# Patient Record
Sex: Female | Born: 1950 | Race: White | Hispanic: No | Smoking: Never smoker
Health system: Southern US, Community
[De-identification: ages and names within clinical notes are randomized; demographics above are authoritative.]

## PROBLEM LIST (undated history)

## (undated) DIAGNOSIS — E049 Nontoxic goiter, unspecified: Secondary | ICD-10-CM

## (undated) DIAGNOSIS — M75101 Unspecified rotator cuff tear or rupture of right shoulder, not specified as traumatic: Secondary | ICD-10-CM

## (undated) DIAGNOSIS — J189 Pneumonia, unspecified organism: Secondary | ICD-10-CM

## (undated) HISTORY — DX: Nontoxic goiter, unspecified: E04.9

## (undated) HISTORY — DX: Unspecified rotator cuff tear or rupture of right shoulder, not specified as traumatic: M75.101

## (undated) HISTORY — PX: CHOLECYSTECTOMY: SHX55

---

## 1997-11-20 ENCOUNTER — Other Ambulatory Visit: Admission: RE | Admit: 1997-11-20 | Discharge: 1997-11-20 | Payer: Self-pay | Admitting: *Deleted

## 1998-09-22 ENCOUNTER — Other Ambulatory Visit: Admission: RE | Admit: 1998-09-22 | Discharge: 1998-09-22 | Payer: Self-pay | Admitting: *Deleted

## 1999-10-05 ENCOUNTER — Other Ambulatory Visit: Admission: RE | Admit: 1999-10-05 | Discharge: 1999-10-05 | Payer: Self-pay | Admitting: *Deleted

## 2000-09-05 ENCOUNTER — Other Ambulatory Visit: Admission: RE | Admit: 2000-09-05 | Discharge: 2000-09-05 | Payer: Self-pay | Admitting: *Deleted

## 2003-02-04 ENCOUNTER — Other Ambulatory Visit: Admission: RE | Admit: 2003-02-04 | Discharge: 2003-02-04 | Payer: Self-pay | Admitting: Gynecology

## 2004-01-01 ENCOUNTER — Ambulatory Visit: Payer: Self-pay | Admitting: Orthopedic Surgery

## 2004-01-15 ENCOUNTER — Ambulatory Visit (HOSPITAL_COMMUNITY): Admission: RE | Admit: 2004-01-15 | Discharge: 2004-01-15 | Payer: Self-pay | Admitting: Internal Medicine

## 2004-02-05 ENCOUNTER — Other Ambulatory Visit: Admission: RE | Admit: 2004-02-05 | Discharge: 2004-02-05 | Payer: Self-pay | Admitting: Gynecology

## 2004-05-14 ENCOUNTER — Encounter (INDEPENDENT_AMBULATORY_CARE_PROVIDER_SITE_OTHER): Payer: Self-pay | Admitting: *Deleted

## 2004-05-14 ENCOUNTER — Ambulatory Visit (HOSPITAL_BASED_OUTPATIENT_CLINIC_OR_DEPARTMENT_OTHER): Admission: RE | Admit: 2004-05-14 | Discharge: 2004-05-14 | Payer: Self-pay | Admitting: Gynecology

## 2005-01-18 ENCOUNTER — Other Ambulatory Visit: Admission: RE | Admit: 2005-01-18 | Discharge: 2005-01-18 | Payer: Self-pay | Admitting: Gynecology

## 2005-11-10 ENCOUNTER — Ambulatory Visit (HOSPITAL_COMMUNITY): Admission: RE | Admit: 2005-11-10 | Discharge: 2005-11-10 | Payer: Self-pay | Admitting: General Surgery

## 2006-01-24 ENCOUNTER — Other Ambulatory Visit: Admission: RE | Admit: 2006-01-24 | Discharge: 2006-01-24 | Payer: Self-pay | Admitting: Gynecology

## 2006-08-30 ENCOUNTER — Emergency Department (HOSPITAL_COMMUNITY): Admission: EM | Admit: 2006-08-30 | Discharge: 2006-08-30 | Payer: Self-pay | Admitting: Emergency Medicine

## 2010-04-07 ENCOUNTER — Other Ambulatory Visit: Payer: Self-pay | Admitting: Gynecology

## 2010-05-10 ENCOUNTER — Other Ambulatory Visit: Payer: Self-pay | Admitting: Gastroenterology

## 2010-07-02 NOTE — Op Note (Signed)
NAME:  Lisa, Cisneros NO.:  [id number]   MEDICAL RECORD NO.:  [id number]          PATIENT TYPE:  AMB   LOCATION:  [facility]                         FACILITY:  [facility]   PHYSICIAN:  Gretta Cool, M.D. DATE OF BIRTH:  09-Sep-1950   DATE OF PROCEDURE:  DATE OF DISCHARGE:                                 OPERATIVE REPORT   PREOPERATIVE DIAGNOSIS:  Abnormal uterine bleeding with endometrial polyps  and endometrial thickening on ultrasound.  Simple hyperplasia on dilation  and curettage.   POSTOPERATIVE DIAGNOSES:  Abnormal uterine bleeding with endometrial polyps  and endometrial thickening on ultrasound.  Simple hyperplasia on dilation  and curettage.   PROCEDURE:  Hysteroscopy resection of multiple endometrial polyps, total  endometrial resection for ablation.   SURGEON:  Gretta Cool, M.D.   ANESTHESIA:  IV sedation and paracervical block.   DESCRIPTION OF PROCEDURE:  Under satisfactory IV sedation as above with the  patient completely unaware of any discomfort, the cervix was grasped with a  single-tooth tenaculum and pulled down in view.  It was then progressively  dilated with a series of Pratt dilators.  At this point the 7 mm  resectoscope was introduced and the cavity photographed.  There were  multiple polyps, very large tubal ostia were present on both cornual areas.  There was no evidence of fibroid or other pathology.  The cornual areas were  first resected and then sealed by cautery.  The polyps were then removed and  the entire endometrial cavity then resected so as to remove the endometrium  down 5 mm or more into the myometrium so as to remove all of the endometrial  tissue.  Once the entire cavity was resected the cavity was treated with  VaporTrode so as to eliminate any islands of residual endometrial tissue.  At the end of the procedure there was no bleeding at reduced pressure.  The  procedure was terminated without complication and the  patient returned to  the recovery room in excellent condition.  Fluid deficit 40 mL.      CWL/MEDQ  D:  05/14/2004  T:  05/14/2004  Job:  [id number]

## 2011-04-13 ENCOUNTER — Other Ambulatory Visit: Payer: Self-pay | Admitting: Gynecology

## 2011-04-13 LAB — COMPREHENSIVE METABOLIC PANEL
ALT: 13 U/L (ref 7–35)
AST: 20 U/L
Albumin: 3.8
Alkaline Phosphatase: 52 U/L
BUN: 13 mg/dL (ref 4–21)
Creat: 0.67
Glucose: 80
Sodium: 142 mmol/L (ref 137–147)
Total Bilirubin: 0.5 mg/dL

## 2011-04-13 LAB — CBC WITH DIFFERENTIAL/PLATELET
HCT: 41 %
Hemoglobin: 13.6 g/dL (ref 12.0–16.0)
MCV: 92.8 fL
WBC: 5.6

## 2011-06-27 ENCOUNTER — Encounter: Payer: Self-pay | Admitting: Gastroenterology

## 2011-06-27 ENCOUNTER — Ambulatory Visit (INDEPENDENT_AMBULATORY_CARE_PROVIDER_SITE_OTHER): Payer: BC Managed Care – PPO | Admitting: Gastroenterology

## 2011-06-27 VITALS — BP 127/71 | HR 62 | Temp 97.7°F | Ht 63.5 in | Wt 105.2 lb

## 2011-06-27 DIAGNOSIS — R197 Diarrhea, unspecified: Secondary | ICD-10-CM

## 2011-06-27 DIAGNOSIS — R198 Other specified symptoms and signs involving the digestive system and abdomen: Secondary | ICD-10-CM

## 2011-06-27 DIAGNOSIS — R109 Unspecified abdominal pain: Secondary | ICD-10-CM

## 2011-06-27 DIAGNOSIS — R194 Change in bowel habit: Secondary | ICD-10-CM

## 2011-06-27 MED ORDER — ALIGN 4 MG PO CAPS: 4.0000 mg | ORAL_CAPSULE | Freq: Every day | ORAL | Status: DC

## 2011-06-27 MED ORDER — DICYCLOMINE HCL 10 MG PO CAPS
10.0000 mg | ORAL_CAPSULE | Freq: Three times a day (TID) | ORAL | Status: DC | PRN
Start: 2011-06-27 — End: 2021-07-22

## 2011-06-27 NOTE — Progress Notes (Signed)
Faxed to PCP

## 2011-06-27 NOTE — Assessment & Plan Note (Signed)
Three weeks h/o acute/persistent diarrhea and abdominal cramping. Thought she had virus but symptoms never improved. No blood in stool. Diminished appetite. Patient c/o significant personal stress and feels it is affecting her appetite. Need to exclude infectious etiology. She had screening colonoscopy one year ago by Dr. Kinnie Scales and states she had inflamed rectum but negative biopsy. Will request records as well as last labs. Start bentyl 10mg  every morning and may use up to tid if needed. Start Align one daily. Based on findings, she may need w/u for microscopic colitis.

## 2011-06-27 NOTE — Progress Notes (Signed)
Primary Care Physician:  Carylon Perches, MD, MD  Primary Gastroenterologist:  Roetta Sessions, MD   Chief Complaint  Patient presents with  . Diarrhea  . Abdominal Pain    HPI:  Lisa Cisneros is a 61 y.o. female here as self-referral for change in bowels and abdominal cramps.  Symptoms started three weeks. Started with several loos bowel movements. Thought she had a virus but symptoms never went completely away. Diarrhea loose several times each morning. Abdominal cramping with it. No n/v. Stress since 02/2011, appetite not as good. Denies prior h/o IBS. No melena, brbpr. Lots of gas. No nocturnal diarrhea. Tried anti-diarrheal medicine and Pepto-Bismol but didn't seem to help much. Only new medication is melatonin for past one month. No heartburn. No unintentional weight loss.   Colonoscopy in [place], Dr. Kinnie Scales, normal exam, 04/2010. No prior EGD. Last labs done by Dr. Nicholas Lose in 04/2011.   Current Outpatient Prescriptions  Medication Sig Dispense Refill  . Acetylcarnitine HCl (ACETYL L-CARNITINE) 500 MG CAPS Take by mouth.      . ALPHA LIPOIC ACID PO Take 600 mg by mouth daily.      Florian Buff Oil-GLA-Linoleic Acid (BORAGE PO) Take 800 mg by mouth daily.      . Cholecalciferol (VITAMIN D-3 PO) Take 2,000 Units by mouth daily.      . Cyanocobalamin (VITAMIN B-12) 500 MCG SUBL Place under the tongue.      . fish oil-omega-3 fatty acids 1000 MG capsule Take 2 g by mouth daily.      . Flaxseed, Linseed, (FLAX PO) Take 800 mg by mouth daily.      . folic acid (FOLVITE) 800 MCG tablet Take 400 mcg by mouth daily.      Marland Kitchen Kelp 225 MCG TABS Take by mouth.      . Melatonin 10 MG CAPS Take 10 mg by mouth daily.      Marland Kitchen MINIVELLE 0.075 MG/24HR Place 1 patch onto the skin 2 (two) times a week.       . progesterone (PROMETRIUM) 200 MG capsule Take 200 mg by mouth every 3 (three) months.        Allergies as of 06/27/2011 - Review Complete 06/27/2011  Allergen Reaction Noted  . Codeine Nausea And  Vomiting 06/27/2011  EES causes severe hunger pains  Past Medical History  Diagnosis Date  . Goiter      Past Surgical History  Procedure Date  . Cholecystectomy    Family History  Problem Relation Age of Onset  . Alzheimer's disease Mother   . Colon cancer Neg Hx   . Liver disease Neg Hx     History   Social History  . Marital Status: Divorced    Spouse Name: N/A    Number of Children: 1  . Years of Education: N/A   Occupational History  . works at [employer] History Main Topics  . Smoking status: Never Smoker   . Smokeless tobacco: None  . Alcohol Use: Yes     3 glass in a week  . Drug Use: No  . Sexually Active: None   Other Topics Concern  . None   Social History Narrative  . None     ROS:  General: Negative for anorexia, weight loss, fever, chills, fatigue, weakness. Appetite diminished some.  Eyes: Negative for vision changes.  ENT: Negative for hoarseness, difficulty swallowing , nasal congestion. CV: Negative for chest pain, angina, palpitations, dyspnea on exertion, peripheral edema.  Respiratory:  Negative for dyspnea at rest, dyspnea on exertion, cough, sputum, wheezing.  GI: See history of present illness. GU:  Negative for dysuria, hematuria, urinary incontinence, urinary frequency. Positive for nocturnal urination.  MS: Negative for joint pain, low back pain.  Derm: Negative for rash or itching.  Neuro: Negative for weakness, abnormal sensation, seizure, frequent headaches, memory loss, confusion.  Psych: Negative for anxiety, depression, suicidal ideation, hallucinations. Lots of stress but would not elaborate. Endo: Negative for unusual weight change.  Heme: Negative for bruising or bleeding. Allergy: Negative for rash or hives.    Physical Examination:  BP 127/71  Pulse 62  Temp(Src) 97.7 F (36.5 C) (Temporal)  Ht 5' 3.5" (1.613 m)  Wt 105 lb 3.2 oz (47.718 kg)  BMI 18.34 kg/m2   General: Thin, well-developed in no acute  distress.  Head: Normocephalic, atraumatic.   Eyes: Conjunctiva pink, no icterus. Mouth: Oropharyngeal mucosa moist and pink , no lesions erythema or exudate. Neck: Supple without thyromegaly, masses, or lymphadenopathy.  Lungs: Clear to auscultation bilaterally.  Heart: Regular rate and rhythm, no murmurs rubs or gallops.  Abdomen: Bowel sounds are normal, nontender, nondistended, no hepatosplenomegaly or masses, no abdominal bruits or hernia , no rebound or guarding.   Rectal: Not performed. Extremities: No lower extremity edema. No clubbing or deformities.  Neuro: Alert and oriented x 4 , grossly normal neurologically.  Skin: Warm and dry, no rash or jaundice.   Psych: Alert and cooperative, normal mood and affect.

## 2011-06-27 NOTE — Patient Instructions (Signed)
We will review your recent labs and last colonoscopy records. Please have your stool test done. Please start Bentyl 10mg  every morning before breakfast. You may take up to three times a day for your diarrhea, abdominal cramps. Please take Align once daily for next three weeks, samples provided. This is a good probiotic to help with digestion and gas.

## 2011-06-30 LAB — CLOSTRIDIUM DIFFICILE BY PCR: Toxigenic C. Difficile by PCR: NOT DETECTED

## 2011-06-30 LAB — GIARDIA/CRYPTOSPORIDIUM (EIA)
Cryptosporidium Screen (EIA): NEGATIVE
Giardia Screen (EIA): NEGATIVE

## 2011-06-30 NOTE — Progress Notes (Signed)
Quick Note:  Await stool culture ______ 

## 2011-07-03 LAB — STOOL CULTURE

## 2011-07-13 NOTE — Progress Notes (Signed)
Reviewed colonoscopy reports from Dr. Sharrell Ku.Colonoscopy for March 2012, prep was good. Colon was normal except for a thickened and erythematous rectal mucosa the mucosa was intact without ulceration. She had internal hemorrhoids. Endoscopic picture most consistent with chronic prolapse. Biopsy nonspecific but with intramucosal fibrosis. Recommended to have a repeat colonoscopy in 2017. She had a colonoscopy in March 2007 as well. At that time she had acute proctitis which was mild. Biopsy showed fibrosis which can be seen with ischemia or chronic inflammatory bowel disease.  Labs from Dr. Nicholas Lose. February 2013. White blood cell count 5600, hemoglobin 13.6, hematocrit 41, MCV 92.8, platelets 224,000, sodium 142, potassium 4, glucose 80, BUN 13, creatinine 0.67, total bilirubin 0.5, alkaline phosphatase 52, 80 ST 20, ALT 13, albumin 3.8, TSH 2.169, CRP 0.6, hemoglobin A1c 5.6.

## 2011-07-13 NOTE — Progress Notes (Signed)
Quick Note:  All stools negative for infection.  Reviewed records from labs and last two colonoscopies. On the last TCS she had some mild proctitis vs prolapse, bx benign.  See how she is doing on bentyl and Align. If still with significant diarrhea, she may need repeat endoscopy with random bx to r/o microscopic colitis. ______

## 2011-07-13 NOTE — Progress Notes (Signed)
Quick Note:  Tried to call pt- LMOM ______ 

## 2012-04-17 ENCOUNTER — Other Ambulatory Visit: Payer: Self-pay | Admitting: Gynecology

## 2012-07-24 ENCOUNTER — Ambulatory Visit (INDEPENDENT_AMBULATORY_CARE_PROVIDER_SITE_OTHER): Payer: BC Managed Care – PPO

## 2012-07-24 ENCOUNTER — Ambulatory Visit (INDEPENDENT_AMBULATORY_CARE_PROVIDER_SITE_OTHER): Payer: BC Managed Care – PPO | Admitting: Orthopedic Surgery

## 2012-07-24 ENCOUNTER — Encounter: Payer: Self-pay | Admitting: Orthopedic Surgery

## 2012-07-24 VITALS — BP 116/80 | Ht 63.5 in | Wt 107.0 lb

## 2012-07-24 DIAGNOSIS — M67919 Unspecified disorder of synovium and tendon, unspecified shoulder: Secondary | ICD-10-CM

## 2012-07-24 DIAGNOSIS — M25519 Pain in unspecified shoulder: Secondary | ICD-10-CM

## 2012-07-24 DIAGNOSIS — M25511 Pain in right shoulder: Secondary | ICD-10-CM | POA: Insufficient documentation

## 2012-07-24 DIAGNOSIS — M75101 Unspecified rotator cuff tear or rupture of right shoulder, not specified as traumatic: Secondary | ICD-10-CM

## 2012-07-24 HISTORY — DX: Unspecified rotator cuff tear or rupture of right shoulder, not specified as traumatic: M75.101

## 2012-07-24 NOTE — Patient Instructions (Addendum)
Impingement Syndrome, Rotator Cuff, Bursitis with Rehab Impingement syndrome is a condition that involves inflammation of the tendons of the rotator cuff and the subacromial bursa, that causes pain in the shoulder. The rotator cuff consists of four tendons and muscles that control much of the shoulder and upper arm function. The subacromial bursa is a fluid filled sac that helps reduce friction between the rotator cuff and one of the bones of the shoulder (acromion). Impingement syndrome is usually an overuse injury that causes swelling of the bursa (bursitis), swelling of the tendon (tendonitis), and/or a tear of the tendon (strain). Strains are classified into three categories. Grade 1 strains cause pain, but the tendon is not lengthened. Grade 2 strains include a lengthened ligament, due to the ligament being stretched or partially ruptured. With grade 2 strains there is still function, although the function may be decreased. Grade 3 strains include a complete tear of the tendon or muscle, and function is usually impaired. SYMPTOMS   Pain around the shoulder, often at the outer portion of the upper arm.  Pain that gets worse with shoulder function, especially when reaching overhead or lifting.  Sometimes, aching when not using the arm.  Pain that wakes you up at night.  Sometimes, tenderness, swelling, warmth, or redness over the affected area.  Loss of strength.  Limited motion of the shoulder, especially reaching behind the back (to the back pocket or to unhook bra) or across your body.  Crackling sound (crepitation) when moving the arm.  Biceps tendon pain and inflammation (in the front of the shoulder). Worse when bending the elbow or lifting. CAUSES  Impingement syndrome is often an overuse injury, in which chronic (repetitive) motions cause the tendons or bursa to become inflamed. A strain occurs when a force is paced on the tendon or muscle that is greater than it can withstand.  Common mechanisms of injury include: Stress from sudden increase in duration, frequency, or intensity of training.  Direct hit (trauma) to the shoulder.  Aging, erosion of the tendon with normal use.  Bony bump on shoulder (acromial spur). RISK INCREASES WITH:  Contact sports (football, wrestling, boxing).  Throwing sports (baseball, tennis, volleyball).  Weightlifting and bodybuilding.  Heavy labor.  Previous injury to the rotator cuff, including impingement.  Poor shoulder strength and flexibility.  Failure to warm up properly before activity.  Inadequate protective equipment.  Old age.  Bony bump on shoulder (acromial spur). PREVENTION   Warm up and stretch properly before activity.  Allow for adequate recovery between workouts.  Maintain physical fitness:  Strength, flexibility, and endurance.  Cardiovascular fitness.  Learn and use proper exercise technique. PROGNOSIS  If treated properly, impingement syndrome usually goes away within 6 weeks. Sometimes surgery is required.  RELATED COMPLICATIONS   Longer healing time if not properly treated, or if not given enough time to heal.  Recurring symptoms, that result in a chronic condition.  Shoulder stiffness, frozen shoulder, or loss of motion.  Rotator cuff tendon tear.  Recurring symptoms, especially if activity is resumed too soon, with overuse, with a direct blow, or when using poor technique. TREATMENT  Treatment first involves the use of ice and medicine, to reduce pain and inflammation. The use of strengthening and stretching exercises may help reduce pain with activity. These exercises may be performed at home or with a therapist. If non-surgical treatment is unsuccessful after more than 6 months, surgery may be advised. After surgery and rehabilitation, activity is usually possible in 3 months.  MEDICATION  If pain medicine is needed, nonsteroidal anti-inflammatory medicines (aspirin and  ibuprofen), or other minor pain relievers (acetaminophen), are often advised.  Do not take pain medicine for 7 days before surgery.  Prescription pain relievers may be given, if your caregiver thinks they are needed. Use only as directed and only as much as you need.  Corticosteroid injections may be given by your caregiver. These injections should be reserved for the most serious cases, because they may only be given a certain number of times. HEAT AND COLD  Cold treatment (icing) should be applied for 10 to 15 minutes every 2 to 3 hours for inflammation and pain, and immediately after activity that aggravates your symptoms. Use ice packs or an ice massage.  Heat treatment may be used before performing stretching and strengthening activities prescribed by your caregiver, physical therapist, or athletic trainer. Use a heat pack or a warm water soak. SEEK MEDICAL CARE IF:   Symptoms get worse or do not improve in 4 to 6 weeks, despite treatment.  New, unexplained symptoms develop. (Drugs used in treatment may produce side effects.)   Start home exercises as directed

## 2012-07-24 NOTE — Progress Notes (Signed)
Patient ID: Lisa Cisneros, female   DOB: 07/26/1950, 62 y.o.   MRN: [id number] Chief Complaint  Patient presents with  . 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 Current treatment Advil and Aleve as needed. Pain described as sharp and stabbing, 7/10, constant, better with ice worse with movements of the arm especially with the arm away from her body. She has a history of cervical bulging disc previous MRI and neurosurgical evaluation-treated with exercise, there is some catching in the shoulder and some numbness in the hands which may be related to the cervical disc and/or carpal tunnel syndrome  She is allergic to codeine and latex  She has had a cholecystectomy. She has no other major medical problems listed. She has a family history of heart disease and diabetes. 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 she doesn't smoke she has an occasional glass of red wine several times a week and she has a high school degree posterior year of college  Review of systems she has numbness anxiety itching listed she has joint pain and stiffness and muscle pain related to her arm the other systems were reviewed and were negative  Her medications include texture strength joint juice, multivitamin, B12, folic acid, multiecho make a, zinc lozenge, l-lysine, closed, Calc, cinnamon.BP 116/80  Ht 5' 3.5" (1.613 m)  Wt 107 lb (48.535 kg)  BMI 18.65 kg/m2 General appearance is normal, the patient is alert and oriented x3 with normal mood and affect. She walk without any assistive devices. Her left shoulder is not tender to palpation, she has full active range of motion. She has a negative apprehension sign. She has a strong 5 out of 5 rotator cuff with normal skin normal pulse normal temperature and normal sensation  Right shoulder is tender anterior rotator interval, posterior inferior to the acromion. She has  full range of motion the Hawkins maneuver produced some discomfort the Neer maneuver did not she had some tightness in terms of internal rotation she has a negative apprehension sign she had mild weakness in the supraspinatus with the empty can sign. Skin is intact good pulse normal temperature normal sensation  X-rays show normal shoulder with increased sclerotic bone at the greater tuberosity which may indicate chronic stress at the rotator cuff insertion  Impression Encounter Diagnoses  Name Primary?  . Right shoulder pain Yes  . Rotator cuff syndrome of right shoulder     Plan We gave her a subacromial injection and advised home exercises followup 6 weeks  Shoulder Injection Procedure Note   Pre-operative Diagnosis: right  RC Syndrome  Post-operative Diagnosis: same  Indications: pain   Anesthesia: ethyl chloride   Procedure Details   Verbal consent was obtained for the procedure. The shoulder was prepped withalcohol and the skin was anesthetized. A 20 gauge needle was advanced into the subacromial space through posterior approach without difficulty  The space was then injected with 3 ml 1% lidocaine and 1 ml of depomedrol. The injection site was cleansed with isopropyl alcohol and a dressing was applied.  Complications:  None; patient tolerated the procedure well.

## 2012-09-06 ENCOUNTER — Ambulatory Visit: Payer: BC Managed Care – PPO | Admitting: Orthopedic Surgery

## 2014-08-27 DIAGNOSIS — Z1283 Encounter for screening for malignant neoplasm of skin: Secondary | ICD-10-CM | POA: Diagnosis not present

## 2014-08-27 DIAGNOSIS — L820 Inflamed seborrheic keratosis: Secondary | ICD-10-CM | POA: Diagnosis not present

## 2014-08-27 DIAGNOSIS — S70362A Insect bite (nonvenomous), left thigh, initial encounter: Secondary | ICD-10-CM | POA: Diagnosis not present

## 2014-10-27 DIAGNOSIS — J029 Acute pharyngitis, unspecified: Secondary | ICD-10-CM | POA: Diagnosis not present

## 2014-10-27 DIAGNOSIS — Z681 Body mass index (BMI) 19 or less, adult: Secondary | ICD-10-CM | POA: Diagnosis not present

## 2014-11-03 ENCOUNTER — Emergency Department (HOSPITAL_COMMUNITY): Payer: Medicare Other

## 2014-11-03 ENCOUNTER — Encounter (HOSPITAL_COMMUNITY): Payer: Self-pay | Admitting: *Deleted

## 2014-11-03 ENCOUNTER — Emergency Department (HOSPITAL_COMMUNITY)
Admission: EM | Admit: 2014-11-03 | Discharge: 2014-11-03 | Disposition: A | Discharge status: Home / Self Care | Payer: Medicare Other | Attending: Emergency Medicine | Admitting: Emergency Medicine

## 2014-11-03 DIAGNOSIS — Z79899 Other long term (current) drug therapy: Secondary | ICD-10-CM | POA: Diagnosis not present

## 2014-11-03 DIAGNOSIS — J189 Pneumonia, unspecified organism: Secondary | ICD-10-CM

## 2014-11-03 DIAGNOSIS — Z8639 Personal history of other endocrine, nutritional and metabolic disease: Secondary | ICD-10-CM | POA: Insufficient documentation

## 2014-11-03 DIAGNOSIS — Z91040 Latex allergy status: Secondary | ICD-10-CM | POA: Diagnosis not present

## 2014-11-03 DIAGNOSIS — Z8739 Personal history of other diseases of the musculoskeletal system and connective tissue: Secondary | ICD-10-CM | POA: Diagnosis not present

## 2014-11-03 DIAGNOSIS — R05 Cough: Secondary | ICD-10-CM | POA: Diagnosis present

## 2014-11-03 DIAGNOSIS — J159 Unspecified bacterial pneumonia: Secondary | ICD-10-CM | POA: Insufficient documentation

## 2014-11-03 HISTORY — DX: Pneumonia, unspecified organism: J18.9

## 2014-11-03 LAB — BASIC METABOLIC PANEL
Anion gap: 8 (ref 5–15)
BUN: 8 mg/dL (ref 6–20)
CALCIUM: 8.4 mg/dL — AB (ref 8.9–10.3)
CO2: 27 mmol/L (ref 22–32)
Chloride: 100 mmol/L — ABNORMAL LOW (ref 101–111)
Creatinine, Ser: 0.59 mg/dL (ref 0.44–1.00)
GFR calc non Af Amer: >60 mL/min (ref >60)
Glucose, Bld: 134 mg/dL — ABNORMAL HIGH (ref 65–99)
Potassium: 3.7 mmol/L (ref 3.5–5.1)
Sodium: 135 mmol/L (ref 135–145)

## 2014-11-03 LAB — CBC WITH DIFFERENTIAL/PLATELET
Basophils Absolute: 0 10*3/uL (ref 0.0–0.1)
Basophils Relative: 0 %
Eosinophils Absolute: 0 10*3/uL (ref 0.0–0.7)
Eosinophils Relative: 0 %
HCT: 36.8 % (ref 36.0–46.0)
Hemoglobin: 13.1 g/dL (ref 12.0–15.0)
Lymphocytes Relative: 11 %
Lymphs Abs: 1.6 10*3/uL (ref 0.7–4.0)
MCH: 32 pg (ref 26.0–34.0)
MCHC: 35.6 g/dL (ref 30.0–36.0)
MCV: 89.8 fL (ref 78.0–100.0)
MONOS PCT: 5 %
Monocytes Absolute: 0.6 10*3/uL (ref 0.1–1.0)
Neutro Abs: 12.1 10*3/uL — ABNORMAL HIGH (ref 1.7–7.7)
Neutrophils Relative %: 84 %
Platelets: 352 10*3/uL (ref 150–400)
RBC: 4.1 MIL/uL (ref 3.87–5.11)
RDW: 12.3 % (ref 11.5–15.5)
WBC: 14.4 10*3/uL — ABNORMAL HIGH (ref 4.0–10.5)

## 2014-11-03 MED ORDER — ONDANSETRON 4 MG PO TBDP: 4.0000 mg | ORAL_TABLET | Freq: Three times a day (TID) | ORAL | Status: DC | PRN

## 2014-11-03 MED ORDER — SODIUM CHLORIDE 0.9 % IV BOLUS (SEPSIS)
1000.0000 mL | Freq: Once | INTRAVENOUS | Status: AC
  Administered 2014-11-03: 1000 mL via INTRAVENOUS

## 2014-11-03 MED ORDER — SODIUM CHLORIDE 0.9 % IV SOLN: Freq: Once | INTRAVENOUS | Status: DC

## 2014-11-03 MED ORDER — ONDANSETRON HCL 4 MG/2ML IJ SOLN
4.0000 mg | Freq: Once | INTRAMUSCULAR | Status: AC
  Administered 2014-11-03: 4 mg via INTRAVENOUS
  Filled 2014-11-03: qty 2

## 2014-11-03 MED ORDER — DEXTROSE 5 % IV SOLN
1.0000 g | Freq: Once | INTRAVENOUS | Status: AC
  Administered 2014-11-03: 1 g via INTRAVENOUS
  Filled 2014-11-03: qty 10

## 2014-11-03 MED ORDER — LEVOFLOXACIN 500 MG PO TABS: 500.0000 mg | ORAL_TABLET | Freq: Every day | ORAL | Status: DC

## 2014-11-03 NOTE — ED Notes (Signed)
Pt c/o coughing up green sputum and states she has been vomiting all day; pt states she has been taking amoxicillin for the last 10 days for strep throat

## 2014-11-03 NOTE — Discharge Instructions (Signed)
Pneumonia, Adult  Pneumonia is an infection of the lungs. It may be caused by a germ (virus or bacteria). Some types of pneumonia can spread easily from person to person. This can happen when you cough or sneeze.  HOME CARE   Only take medicine as told by your doctor.   Take your medicine (antibiotics) as told. Finish it even if you start to feel better.   Do not smoke.   You may use a vaporizer or humidifier in your room. This can help loosen thick spit (mucus).   Sleep so you are almost sitting up (semi-upright). This helps reduce coughing.   Rest.  A shot (vaccine) can help prevent pneumonia. Shots are often advised for:   People over 65 years old.   Patients on chemotherapy.   People with long-term (chronic) lung problems.   People with immune system problems.  GET HELP RIGHT AWAY IF:    You are getting worse.   You cannot control your cough, and you are losing sleep.   You cough up blood.   Your pain gets worse, even with medicine.   You have a fever.   Any of your problems are getting worse, not better.   You have shortness of breath or chest pain.  MAKE SURE YOU:    Understand these instructions.   Will watch your condition.   Will get help right away if you are not doing well or get worse.  Document Released: 07/20/2007 Document Revised: 04/25/2011 Document Reviewed: 04/23/2010  Exit[employer] Patient Information 2015 [employer]. This information is not intended to replace advice given to you by your health care provider. Make sure you discuss any questions you have with your health care provider.

## 2014-11-04 NOTE — ED Provider Notes (Signed)
CSN: [id number]     Arrival date & time 11/03/14  1917 History   First MD Initiated Contact with Patient 11/03/14 2058     Chief Complaint  Patient presents with  . Cough      HPI  She presents for evaluation of vomiting. History of "strep throat" diagnosed clinically by her physician. His finishing day 8 of amoxicillin. Waking this morning with some nausea that she attributes to the amoxicillin. Started vomiting later in the day and is unable to stop. Presents here for evaluation. Has had a continued cough. No chest pain. Her throat is "better".  Past Medical History  Diagnosis Date  . Goiter   . Rotator cuff syndrome of right shoulder 07/24/2012  . Pneumonia    Past Surgical History  Procedure Laterality Date  . Cholecystectomy     Family History  Problem Relation Age of Onset  . Alzheimer's disease Mother   . Colon cancer Neg Hx   . Liver disease Neg Hx    Social History  Substance Use Topics  . Smoking status: Never Smoker   . Smokeless tobacco: None  . Alcohol Use: Yes     Comment: 3 glass in a week   OB History    No data available     Review of Systems  Constitutional: Positive for fever. Negative for chills, diaphoresis, appetite change and fatigue.  HENT: Positive for sore throat. Negative for mouth sores and trouble swallowing.   Eyes: Negative for visual disturbance.  Respiratory: Positive for cough. Negative for chest tightness, shortness of breath and wheezing.   Cardiovascular: Negative for chest pain.  Gastrointestinal: Positive for nausea and vomiting. Negative for abdominal pain, diarrhea and abdominal distention.  Endocrine: Negative for polydipsia, polyphagia and polyuria.  Genitourinary: Negative for dysuria, frequency and hematuria.  Musculoskeletal: Negative for gait problem.  Skin: Negative for color change, pallor and rash.  Neurological: Negative for dizziness, syncope, light-headedness and headaches.  Hematological: Does not bruise/bleed  easily.  Psychiatric/Behavioral: Negative for behavioral problems and confusion.      Allergies  Codeine; Erythromycin ethylsuccinate; and Latex  Home Medications   Prior to Admission medications   Medication Sig Start Date End Date Taking? Authorizing Provider  Acetylcarnitine HCl (ACETYL L-CARNITINE) 500 MG CAPS Take by mouth.    Historical Provider, MD  ALPHA LIPOIC ACID PO Take 600 mg by mouth daily.    Historical Provider, MD  Borage Oil-GLA-Linoleic Acid (BORAGE PO) Take 800 mg by mouth daily.    Historical Provider, MD  Cholecalciferol (VITAMIN D-3 PO) Take 2,000 Units by mouth daily.    Historical Provider, MD  Cyanocobalamin (VITAMIN B-12) 500 MCG SUBL Place under the tongue.    Historical Provider, MD  dicyclomine (BENTYL) 10 MG capsule Take 1 capsule (10 mg total) by mouth 3 (three) times daily as needed. 06/27/11 06/26/12  Mahala Menghini, PA-C  fish oil-omega-3 fatty acids 1000 MG capsule Take 2 g by mouth daily.    Historical Provider, MD  Flaxseed, Linseed, (FLAX PO) Take 800 mg by mouth daily.    Historical Provider, MD  folic acid (FOLVITE) 993 MCG tablet Take 400 mcg by mouth daily.    Historical Provider, MD  Kelp 225 MCG TABS Take by mouth.    Historical Provider, MD  levofloxacin (LEVAQUIN) 500 MG tablet Take 1 tablet (500 mg total) by mouth daily. 11/03/14   Tanna Furry, MD  Melatonin 10 MG CAPS Take 10 mg by mouth daily.    Historical Provider,  MD  MINIVELLE 0.075 MG/24HR Place 1 patch onto the skin 2 (two) times a week.  06/01/11   Historical Provider, MD  ondansetron (ZOFRAN ODT) 4 MG disintegrating tablet Take 1 tablet (4 mg total) by mouth every 8 (eight) hours as needed for nausea. 11/03/14   Tanna Furry, MD  Probiotic Product (ALIGN) 4 MG CAPS Take 4 mg by mouth daily. 06/27/11   Mahala Menghini, PA-C  progesterone (PROMETRIUM) 200 MG capsule Take 200 mg by mouth every 3 (three) months.    Historical Provider, MD   BP 129/65 mmHg  Pulse 71  Temp(Src) 98.6 F (37 C)  (Oral)  Resp 18  Ht 5\' 2"  (1.575 m)  Wt 105 lb (47.628 kg)  BMI 19.20 kg/m2  SpO2 98% Physical Exam  Constitutional: She is oriented to person, place, and time. She appears well-developed and well-nourished. No distress.  HENT:  Head: Normocephalic.  Pharynx appears normal. No exudate or hypertrophy. No abscess.  Eyes: Conjunctivae are normal. Pupils are equal, round, and reactive to light. No scleral icterus.  Neck: Normal range of motion. Neck supple. No thyromegaly present.  Cardiovascular: Normal rate and regular rhythm.  Exam reveals no gallop and no friction rub.   No murmur heard. Pulmonary/Chest: Effort normal and breath sounds normal. No respiratory distress. She has no wheezes. She has no rales.  Clear lungs. No abnormal breath sounds. No increased work of breathing.  Abdominal: Soft. Bowel sounds are normal. She exhibits no distension. There is no tenderness. There is no rebound.  Musculoskeletal: Normal range of motion.  Neurological: She is alert and oriented to person, place, and time.  Skin: Skin is warm and dry. No rash noted.  Psychiatric: She has a normal mood and affect. Her behavior is normal.    ED Course  Procedures (including critical care time) Labs Review Labs Reviewed  CBC WITH DIFFERENTIAL/PLATELET - Abnormal; Notable for the following:    WBC 14.4 (*)    Neutro Abs 12.1 (*)    All other components within normal limits  BASIC METABOLIC PANEL - Abnormal; Notable for the following:    Chloride 100 (*)    Glucose, Bld 134 (*)    Calcium 8.4 (*)    All other components within normal limits    Imaging Review Dg Chest 2 View  11/03/2014   CLINICAL DATA:  Cough. Green sputum. Vomiting all day. Taking amoxicillin for the last 10 days for strep throat.  EXAM: CHEST  2 VIEW  COMPARISON:  None.  FINDINGS: The lungs are hyperinflated. The heart is normal in size. There is right middle lobe infiltrate. No pleural effusions. No pulmonary edema. Mild mid thoracic  spondylosis. Surgical clips are noted in the upper abdomen.  IMPRESSION: Right middle lobe infiltrate.   Electronically Signed   By: Nolon Nations M.D.   On: 11/03/2014 22:40   I have personally reviewed and evaluated these images and lab results as part of my medical decision-making.   EKG Interpretation None      MDM   Final diagnoses:  Community acquired pneumonia    Chest x-ray shows right middle lobe pneumonia. After IV fluids she was feeling much improved. His leukocytosis. No concerns on her basic metabolic. Given IV Rocephin. Plan will be home, Levaquin, Zofran, primary care follow-up, ER with acute changes.    Tanna Furry, MD 11/04/14 [phone]

## 2014-11-21 DIAGNOSIS — H6983 Other specified disorders of Eustachian tube, bilateral: Secondary | ICD-10-CM | POA: Diagnosis not present

## 2014-11-21 DIAGNOSIS — J343 Hypertrophy of nasal turbinates: Secondary | ICD-10-CM | POA: Diagnosis not present

## 2014-11-21 DIAGNOSIS — J342 Deviated nasal septum: Secondary | ICD-10-CM | POA: Diagnosis not present

## 2014-11-21 DIAGNOSIS — J310 Chronic rhinitis: Secondary | ICD-10-CM | POA: Diagnosis not present

## 2014-11-21 DIAGNOSIS — H903 Sensorineural hearing loss, bilateral: Secondary | ICD-10-CM | POA: Diagnosis not present

## 2014-12-08 DIAGNOSIS — J310 Chronic rhinitis: Secondary | ICD-10-CM | POA: Diagnosis not present

## 2014-12-08 DIAGNOSIS — J343 Hypertrophy of nasal turbinates: Secondary | ICD-10-CM | POA: Diagnosis not present

## 2014-12-08 DIAGNOSIS — J342 Deviated nasal septum: Secondary | ICD-10-CM | POA: Diagnosis not present

## 2014-12-08 DIAGNOSIS — H903 Sensorineural hearing loss, bilateral: Secondary | ICD-10-CM | POA: Diagnosis not present

## 2014-12-08 DIAGNOSIS — H6983 Other specified disorders of Eustachian tube, bilateral: Secondary | ICD-10-CM | POA: Diagnosis not present

## 2015-01-19 DIAGNOSIS — J343 Hypertrophy of nasal turbinates: Secondary | ICD-10-CM | POA: Diagnosis not present

## 2015-01-19 DIAGNOSIS — J342 Deviated nasal septum: Secondary | ICD-10-CM | POA: Diagnosis not present

## 2015-01-19 DIAGNOSIS — J310 Chronic rhinitis: Secondary | ICD-10-CM | POA: Diagnosis not present

## 2015-05-04 DIAGNOSIS — R05 Cough: Secondary | ICD-10-CM | POA: Diagnosis not present

## 2015-05-04 DIAGNOSIS — Z681 Body mass index (BMI) 19 or less, adult: Secondary | ICD-10-CM | POA: Diagnosis not present

## 2015-05-07 DIAGNOSIS — E039 Hypothyroidism, unspecified: Secondary | ICD-10-CM | POA: Diagnosis not present

## 2015-05-07 DIAGNOSIS — Z79899 Other long term (current) drug therapy: Secondary | ICD-10-CM | POA: Diagnosis not present

## 2015-05-07 DIAGNOSIS — F909 Attention-deficit hyperactivity disorder, unspecified type: Secondary | ICD-10-CM | POA: Diagnosis not present

## 2015-05-07 DIAGNOSIS — M899 Disorder of bone, unspecified: Secondary | ICD-10-CM | POA: Diagnosis not present

## 2015-05-11 DIAGNOSIS — M8589 Other specified disorders of bone density and structure, multiple sites: Secondary | ICD-10-CM | POA: Diagnosis not present

## 2015-05-11 DIAGNOSIS — Z1231 Encounter for screening mammogram for malignant neoplasm of breast: Secondary | ICD-10-CM | POA: Diagnosis not present

## 2015-05-14 DIAGNOSIS — M8580 Other specified disorders of bone density and structure, unspecified site: Secondary | ICD-10-CM | POA: Diagnosis not present

## 2015-05-14 DIAGNOSIS — Z681 Body mass index (BMI) 19 or less, adult: Secondary | ICD-10-CM | POA: Diagnosis not present

## 2015-05-14 DIAGNOSIS — E785 Hyperlipidemia, unspecified: Secondary | ICD-10-CM | POA: Diagnosis not present

## 2015-05-14 DIAGNOSIS — E042 Nontoxic multinodular goiter: Secondary | ICD-10-CM | POA: Diagnosis not present

## 2015-05-14 DIAGNOSIS — Z23 Encounter for immunization: Secondary | ICD-10-CM | POA: Diagnosis not present

## 2015-05-15 DIAGNOSIS — R921 Mammographic calcification found on diagnostic imaging of breast: Secondary | ICD-10-CM | POA: Diagnosis not present

## 2015-05-28 DIAGNOSIS — Z681 Body mass index (BMI) 19 or less, adult: Secondary | ICD-10-CM | POA: Diagnosis not present

## 2015-05-28 DIAGNOSIS — N951 Menopausal and female climacteric states: Secondary | ICD-10-CM | POA: Diagnosis not present

## 2015-05-28 DIAGNOSIS — Z01419 Encounter for gynecological examination (general) (routine) without abnormal findings: Secondary | ICD-10-CM | POA: Diagnosis not present

## 2015-08-31 DIAGNOSIS — N390 Urinary tract infection, site not specified: Secondary | ICD-10-CM | POA: Diagnosis not present

## 2015-09-08 DIAGNOSIS — E785 Hyperlipidemia, unspecified: Secondary | ICD-10-CM | POA: Diagnosis not present

## 2015-09-15 DIAGNOSIS — E785 Hyperlipidemia, unspecified: Secondary | ICD-10-CM | POA: Diagnosis not present

## 2015-09-16 DIAGNOSIS — L570 Actinic keratosis: Secondary | ICD-10-CM | POA: Diagnosis not present

## 2015-09-16 DIAGNOSIS — Z1283 Encounter for screening for malignant neoplasm of skin: Secondary | ICD-10-CM | POA: Diagnosis not present

## 2015-09-16 DIAGNOSIS — X32XXXD Exposure to sunlight, subsequent encounter: Secondary | ICD-10-CM | POA: Diagnosis not present

## 2015-09-16 DIAGNOSIS — L821 Other seborrheic keratosis: Secondary | ICD-10-CM | POA: Diagnosis not present

## 2016-05-12 DIAGNOSIS — Z79899 Other long term (current) drug therapy: Secondary | ICD-10-CM | POA: Diagnosis not present

## 2016-05-12 DIAGNOSIS — F909 Attention-deficit hyperactivity disorder, unspecified type: Secondary | ICD-10-CM | POA: Diagnosis not present

## 2016-05-12 DIAGNOSIS — M899 Disorder of bone, unspecified: Secondary | ICD-10-CM | POA: Diagnosis not present

## 2016-05-12 DIAGNOSIS — E039 Hypothyroidism, unspecified: Secondary | ICD-10-CM | POA: Diagnosis not present

## 2016-05-16 DIAGNOSIS — Z1231 Encounter for screening mammogram for malignant neoplasm of breast: Secondary | ICD-10-CM | POA: Diagnosis not present

## 2016-05-19 DIAGNOSIS — R7301 Impaired fasting glucose: Secondary | ICD-10-CM | POA: Diagnosis not present

## 2016-05-19 DIAGNOSIS — E042 Nontoxic multinodular goiter: Secondary | ICD-10-CM | POA: Diagnosis not present

## 2016-05-19 DIAGNOSIS — E785 Hyperlipidemia, unspecified: Secondary | ICD-10-CM | POA: Diagnosis not present

## 2016-05-19 DIAGNOSIS — M8580 Other specified disorders of bone density and structure, unspecified site: Secondary | ICD-10-CM | POA: Diagnosis not present

## 2016-05-19 DIAGNOSIS — Z6820 Body mass index (BMI) 20.0-20.9, adult: Secondary | ICD-10-CM | POA: Diagnosis not present

## 2016-07-12 DIAGNOSIS — Z01419 Encounter for gynecological examination (general) (routine) without abnormal findings: Secondary | ICD-10-CM | POA: Diagnosis not present

## 2016-07-12 DIAGNOSIS — Z681 Body mass index (BMI) 19 or less, adult: Secondary | ICD-10-CM | POA: Diagnosis not present

## 2016-12-14 DIAGNOSIS — Z23 Encounter for immunization: Secondary | ICD-10-CM | POA: Diagnosis not present

## 2017-01-12 ENCOUNTER — Emergency Department (HOSPITAL_COMMUNITY): Payer: No Typology Code available for payment source

## 2017-01-12 ENCOUNTER — Other Ambulatory Visit: Payer: Self-pay

## 2017-01-12 ENCOUNTER — Emergency Department (HOSPITAL_COMMUNITY)
Admission: EM | Admit: 2017-01-12 | Discharge: 2017-01-12 | Disposition: A | Discharge status: Home / Self Care | Payer: No Typology Code available for payment source | Attending: Emergency Medicine | Admitting: Emergency Medicine

## 2017-01-12 ENCOUNTER — Encounter (HOSPITAL_COMMUNITY): Payer: Self-pay | Admitting: Emergency Medicine

## 2017-01-12 DIAGNOSIS — Y999 Unspecified external cause status: Secondary | ICD-10-CM | POA: Diagnosis not present

## 2017-01-12 DIAGNOSIS — Z79899 Other long term (current) drug therapy: Secondary | ICD-10-CM | POA: Insufficient documentation

## 2017-01-12 DIAGNOSIS — S161XXA Strain of muscle, fascia and tendon at neck level, initial encounter: Secondary | ICD-10-CM | POA: Insufficient documentation

## 2017-01-12 DIAGNOSIS — Y9389 Activity, other specified: Secondary | ICD-10-CM | POA: Diagnosis not present

## 2017-01-12 DIAGNOSIS — Y92410 Unspecified street and highway as the place of occurrence of the external cause: Secondary | ICD-10-CM | POA: Insufficient documentation

## 2017-01-12 DIAGNOSIS — S199XXA Unspecified injury of neck, initial encounter: Secondary | ICD-10-CM | POA: Diagnosis not present

## 2017-01-12 MED ORDER — TRAMADOL HCL 50 MG PO TABS: 50.0000 mg | ORAL_TABLET | Freq: Four times a day (QID) | ORAL | 0 refills | Status: DC | PRN

## 2017-01-12 MED ORDER — METHOCARBAMOL 500 MG PO TABS: 500.0000 mg | ORAL_TABLET | Freq: Three times a day (TID) | ORAL | 0 refills | Status: DC
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
## 2017-01-12 NOTE — Discharge Instructions (Signed)
Apply ice packs on and off to your neck.  Follow-up with your primary doctor for recheck if symptoms are not improving or worsen
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
## 2017-04-20 DIAGNOSIS — Z681 Body mass index (BMI) 19 or less, adult: Secondary | ICD-10-CM | POA: Diagnosis not present

## 2017-04-20 DIAGNOSIS — J019 Acute sinusitis, unspecified: Secondary | ICD-10-CM | POA: Diagnosis not present

## 2017-05-22 DIAGNOSIS — Z1231 Encounter for screening mammogram for malignant neoplasm of breast: Secondary | ICD-10-CM | POA: Diagnosis not present

## 2017-05-22 DIAGNOSIS — M8589 Other specified disorders of bone density and structure, multiple sites: Secondary | ICD-10-CM | POA: Diagnosis not present

## 2017-06-01 DIAGNOSIS — E042 Nontoxic multinodular goiter: Secondary | ICD-10-CM | POA: Diagnosis not present

## 2017-06-01 DIAGNOSIS — F909 Attention-deficit hyperactivity disorder, unspecified type: Secondary | ICD-10-CM | POA: Diagnosis not present

## 2017-06-01 DIAGNOSIS — Z79899 Other long term (current) drug therapy: Secondary | ICD-10-CM | POA: Diagnosis not present

## 2017-06-01 DIAGNOSIS — R7301 Impaired fasting glucose: Secondary | ICD-10-CM | POA: Diagnosis not present

## 2017-06-01 DIAGNOSIS — E785 Hyperlipidemia, unspecified: Secondary | ICD-10-CM | POA: Diagnosis not present

## 2017-06-08 DIAGNOSIS — Z0001 Encounter for general adult medical examination with abnormal findings: Secondary | ICD-10-CM | POA: Diagnosis not present

## 2017-06-08 DIAGNOSIS — M8580 Other specified disorders of bone density and structure, unspecified site: Secondary | ICD-10-CM | POA: Diagnosis not present

## 2017-06-08 DIAGNOSIS — E042 Nontoxic multinodular goiter: Secondary | ICD-10-CM | POA: Diagnosis not present

## 2017-06-08 DIAGNOSIS — E785 Hyperlipidemia, unspecified: Secondary | ICD-10-CM | POA: Diagnosis not present

## 2017-10-12 DIAGNOSIS — X32XXXD Exposure to sunlight, subsequent encounter: Secondary | ICD-10-CM | POA: Diagnosis not present

## 2017-10-12 DIAGNOSIS — L570 Actinic keratosis: Secondary | ICD-10-CM | POA: Diagnosis not present

## 2017-10-12 DIAGNOSIS — L821 Other seborrheic keratosis: Secondary | ICD-10-CM | POA: Diagnosis not present

## 2017-10-12 DIAGNOSIS — D1801 Hemangioma of skin and subcutaneous tissue: Secondary | ICD-10-CM | POA: Diagnosis not present

## 2017-10-12 DIAGNOSIS — Z1283 Encounter for screening for malignant neoplasm of skin: Secondary | ICD-10-CM | POA: Diagnosis not present

## 2017-10-12 DIAGNOSIS — L820 Inflamed seborrheic keratosis: Secondary | ICD-10-CM | POA: Diagnosis not present

## 2017-11-20 DIAGNOSIS — R202 Paresthesia of skin: Secondary | ICD-10-CM | POA: Diagnosis not present

## 2017-11-20 DIAGNOSIS — Z23 Encounter for immunization: Secondary | ICD-10-CM | POA: Diagnosis not present

## 2017-12-19 DIAGNOSIS — G9009 Other idiopathic peripheral autonomic neuropathy: Secondary | ICD-10-CM | POA: Diagnosis not present

## 2018-02-01 DIAGNOSIS — G9009 Other idiopathic peripheral autonomic neuropathy: Secondary | ICD-10-CM | POA: Diagnosis not present
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
## 2018-06-05 DIAGNOSIS — Z79899 Other long term (current) drug therapy: Secondary | ICD-10-CM | POA: Diagnosis not present

## 2018-06-05 DIAGNOSIS — F909 Attention-deficit hyperactivity disorder, unspecified type: Secondary | ICD-10-CM | POA: Diagnosis not present

## 2018-06-05 DIAGNOSIS — G608 Other hereditary and idiopathic neuropathies: Secondary | ICD-10-CM | POA: Diagnosis not present

## 2018-06-05 DIAGNOSIS — M899 Disorder of bone, unspecified: Secondary | ICD-10-CM | POA: Diagnosis not present

## 2018-06-12 DIAGNOSIS — E042 Nontoxic multinodular goiter: Secondary | ICD-10-CM | POA: Diagnosis not present

## 2018-06-12 DIAGNOSIS — E785 Hyperlipidemia, unspecified: Secondary | ICD-10-CM | POA: Diagnosis not present

## 2018-06-12 DIAGNOSIS — G9009 Other idiopathic peripheral autonomic neuropathy: Secondary | ICD-10-CM | POA: Diagnosis not present

## 2018-08-31 DIAGNOSIS — Z1231 Encounter for screening mammogram for malignant neoplasm of breast: Secondary | ICD-10-CM | POA: Diagnosis not present

## 2018-11-19 DIAGNOSIS — Z23 Encounter for immunization: Secondary | ICD-10-CM | POA: Diagnosis not present

## 2020-01-26 ENCOUNTER — Encounter (HOSPITAL_COMMUNITY): Payer: Self-pay

## 2020-01-26 ENCOUNTER — Emergency Department (HOSPITAL_COMMUNITY)
Admission: EM | Admit: 2020-01-26 | Discharge: 2020-01-26 | Disposition: A | Discharge status: Home / Self Care | Payer: Medicare HMO | Attending: Emergency Medicine | Admitting: Emergency Medicine

## 2020-01-26 ENCOUNTER — Other Ambulatory Visit: Payer: Self-pay

## 2020-01-26 DIAGNOSIS — R112 Nausea with vomiting, unspecified: Secondary | ICD-10-CM | POA: Insufficient documentation

## 2020-01-26 DIAGNOSIS — R1084 Generalized abdominal pain: Secondary | ICD-10-CM | POA: Insufficient documentation

## 2020-01-26 DIAGNOSIS — Z91040 Latex allergy status: Secondary | ICD-10-CM | POA: Insufficient documentation

## 2020-01-26 DIAGNOSIS — R1030 Lower abdominal pain, unspecified: Secondary | ICD-10-CM | POA: Diagnosis present

## 2020-01-26 LAB — URINALYSIS, ROUTINE W REFLEX MICROSCOPIC
Bilirubin Urine: NEGATIVE
Glucose, UA: NEGATIVE mg/dL
Hgb urine dipstick: NEGATIVE
Ketones, ur: 5 mg/dL — AB
Leukocytes,Ua: NEGATIVE
Nitrite: NEGATIVE
Protein, ur: NEGATIVE mg/dL
Specific Gravity, Urine: 1.018 (ref 1.005–1.030)
pH: 8 (ref 5.0–8.0)

## 2020-01-26 LAB — COMPREHENSIVE METABOLIC PANEL
ALT: 20 U/L (ref 0–44)
AST: 25 U/L (ref 15–41)
Albumin: 3.7 g/dL (ref 3.5–5.0)
Alkaline Phosphatase: 54 U/L (ref 38–126)
Anion gap: 11 (ref 5–15)
BUN: 17 mg/dL (ref 8–23)
CO2: 23 mmol/L (ref 22–32)
Calcium: 8.9 mg/dL (ref 8.9–10.3)
Chloride: 102 mmol/L (ref 98–111)
Creatinine, Ser: 0.66 mg/dL (ref 0.44–1.00)
GFR, Estimated: >60 mL/min (ref >60)
Glucose, Bld: 142 mg/dL — ABNORMAL HIGH (ref 70–99)
Potassium: 3.6 mmol/L (ref 3.5–5.1)
Sodium: 136 mmol/L (ref 135–145)
Total Bilirubin: 0.7 mg/dL (ref 0.3–1.2)
Total Protein: 6.5 g/dL (ref 6.5–8.1)

## 2020-01-26 LAB — CBC
HCT: 41.9 % (ref 36.0–46.0)
Hemoglobin: 14 g/dL (ref 12.0–15.0)
MCH: 30.8 pg (ref 26.0–34.0)
MCHC: 33.4 g/dL (ref 30.0–36.0)
MCV: 92.1 fL (ref 80.0–100.0)
Platelets: 248 10*3/uL (ref 150–400)
RBC: 4.55 MIL/uL (ref 3.87–5.11)
RDW: 12.6 % (ref 11.5–15.5)
WBC: 8 10*3/uL (ref 4.0–10.5)
nRBC: 0 % (ref 0.0–0.2)

## 2020-01-26 LAB — LIPASE, BLOOD: Lipase: 37 U/L (ref 11–51)

## 2020-01-26 MED ORDER — SODIUM CHLORIDE 0.9 % IV BOLUS
500.0000 mL | Freq: Once | INTRAVENOUS | Status: AC
  Administered 2020-01-26: 11:00:00 500 mL via INTRAVENOUS

## 2020-01-26 MED ORDER — ONDANSETRON HCL 4 MG/2ML IJ SOLN
4.0000 mg | Freq: Once | INTRAMUSCULAR | Status: AC
  Administered 2020-01-26: 4 mg via INTRAVENOUS
  Filled 2020-01-26: qty 2

## 2020-01-26 MED ORDER — ONDANSETRON 4 MG PO TBDP: 4.0000 mg | ORAL_TABLET | Freq: Three times a day (TID) | ORAL | 0 refills | Status: DC | PRN

## 2020-01-26 NOTE — ED Provider Notes (Signed)
[facility] EMERGENCY DEPARTMENT Provider Note   CSN: [id number] Arrival date & time: 01/26/20  5038     History Chief Complaint  Patient presents with  . Abdominal Pain    Lisa Cisneros is a 69 y.o. female with no significant past medical history who presents to the ED via EMS after a near syncopal episode due to severe lower abdominal pain.  Patient states she was getting up to use the bathroom at church and felt severe lower abdominal pain associated with nausea. She admits to a few episodes of non-bloody, non-bilious emesis and non-bloody diarrhea.  Abdominal pain has completely resolved however, patient admits to severe nausea.  Patient denies fever and chills.  Denies urinary vaginal symptoms.  She has received both her Covid vaccines.  Denies sick contacts.  Patient has had a cholecystectomy, but no other abdominal operations.  No treatment prior to arrival.  No aggravating or alleviating factors. Denies chest pain and shortness of breath.  History obtained from patient and past medical records. No interpreter used during encounter.      Past Medical History:  Diagnosis Date  . Goiter   . Pneumonia   . Rotator cuff syndrome of right shoulder 07/24/2012    Patient Active Problem List   Diagnosis Date Noted  . Rotator cuff syndrome of right shoulder 07/24/2012  . Right shoulder pain 07/24/2012  . Diarrhea 06/27/2011  . Change in bowel habits 06/27/2011  . Abdominal cramps 06/27/2011    Past Surgical History:  Procedure Laterality Date  . CHOLECYSTECTOMY       OB History   No obstetric history on file.     Family History  Problem Relation Age of Onset  . Alzheimer's disease Mother   . Colon cancer Neg Hx   . Liver disease Neg Hx     Social History   Tobacco Use  . Smoking status: Never Smoker  . Smokeless tobacco: Never Used  Substance Use Topics  . Alcohol use: Yes    Comment: 3 glass in a week  . Drug use: No    Home Medications Prior to  Admission medications   Medication Sig Start Date End Date Taking? Authorizing Provider  Acetylcarnitine HCl (ACETYL L-CARNITINE) 500 MG CAPS Take by mouth.    [provider]  ALPHA LIPOIC ACID PO Take 600 mg by mouth daily.    [provider]  Borage Oil-GLA-Linoleic Acid (BORAGE PO) Take 800 mg by mouth daily.    [provider]  Cholecalciferol (VITAMIN D-3 PO) Take 2,000 Units by mouth daily.    [provider]  Cyanocobalamin (VITAMIN B-12) 500 MCG SUBL Place under the tongue.    [provider]  dicyclomine (BENTYL) 10 MG capsule Take 1 capsule (10 mg total) by mouth 3 (three) times daily as needed. 06/27/11 06/26/12  Mahala Menghini, PA-C  fish oil-omega-3 fatty acids 1000 MG capsule Take 2 g by mouth daily.    [provider]  Flaxseed, Linseed, (FLAX PO) Take 800 mg by mouth daily.    [provider]  folic acid (FOLVITE) 882 MCG tablet Take 400 mcg by mouth daily.    [provider]  Kelp 225 MCG TABS Take by mouth.    [provider]  levofloxacin (LEVAQUIN) 500 MG tablet Take 1 tablet (500 mg total) by mouth daily. 11/03/14   Tanna Furry, MD  Melatonin 10 MG CAPS Take 10 mg by mouth daily.    [provider]  methocarbamol (  ROBAXIN) 500 MG tablet Take 1 tablet (500 mg total) by mouth 3 (three) times daily. 01/12/17   Triplett, Tammy, PA-C  MINIVELLE 0.075 MG/24HR Place 1 patch onto the skin 2 (two) times a week.  06/01/11   [provider]  ondansetron (ZOFRAN ODT) 4 MG disintegrating tablet Take 1 tablet (4 mg total) by mouth every 8 (eight) hours as needed for nausea. 11/03/14   Tanna Furry, MD  ondansetron (ZOFRAN ODT) 4 MG disintegrating tablet Take 1 tablet (4 mg total) by mouth every 8 (eight) hours as needed for nausea or vomiting. 01/26/20   Suzy Bouchard, PA-C  Probiotic Product (ALIGN) 4 MG CAPS Take 4 mg by mouth daily. 06/27/11   Mahala Menghini, PA-C  progesterone  (PROMETRIUM) 200 MG capsule Take 200 mg by mouth every 3 (three) months.    [provider]  traMADol (ULTRAM) 50 MG tablet Take 1 tablet (50 mg total) by mouth every 6 (six) hours as needed. 01/12/17   Triplett, Tammy, PA-C    Allergies    Codeine, Erythromycin ethylsuccinate, and Latex  Review of Systems   Review of Systems  Constitutional: Negative for chills and fever.  Respiratory: Negative for shortness of breath.   Cardiovascular: Negative for chest pain.  Gastrointestinal: Positive for abdominal pain, diarrhea, nausea and vomiting.  Genitourinary: Negative for dysuria and vaginal discharge.  All other systems reviewed and are negative.   Physical Exam Updated Vital Signs BP (!) 130/59 (BP Location: Right Arm)   Pulse 62   Temp 98.8 F (37.1 C) (Oral)   Resp 18   Ht 5\' 3"  (1.6 m)   Wt 45.4 kg   SpO2 98%   BMI 17.71 kg/m   Physical Exam Vitals and nursing note reviewed.  Constitutional:      General: She is not in acute distress.    Appearance: She is not ill-appearing.  HENT:     Head: Normocephalic.  Eyes:     Pupils: Pupils are equal, round, and reactive to light.  Cardiovascular:     Rate and Rhythm: Normal rate and regular rhythm.     Pulses: Normal pulses.     Heart sounds: Normal heart sounds. No murmur heard. No friction rub. No gallop.   Pulmonary:     Effort: Pulmonary effort is normal.     Breath sounds: Normal breath sounds.  Abdominal:     General: Abdomen is flat. Bowel sounds are normal. There is no distension.     Palpations: Abdomen is soft.     Tenderness: There is no abdominal tenderness. There is no guarding or rebound.     Comments: Abdomen soft, nondistended, nontender to palpation in all quadrants without guarding or peritoneal signs. No rebound.   Musculoskeletal:     Cervical back: Neck supple.     Comments: Able to move all 4 extremities without difficulty.  Skin:    General: Skin is warm and dry.  Neurological:      General: No focal deficit present.     Mental Status: She is alert.  Psychiatric:        Mood and Affect: Mood normal.        Behavior: Behavior normal.     ED Results / Procedures / Treatments   Labs (all labs ordered are listed, but only abnormal results are displayed) Labs Reviewed  COMPREHENSIVE METABOLIC PANEL - Abnormal; Notable for the following components:      Result Value   Glucose, Bld 142 (*)  All other components within normal limits  URINALYSIS, ROUTINE W REFLEX MICROSCOPIC - Abnormal; Notable for the following components:   Ketones, ur 5 (*)    All other components within normal limits  LIPASE, BLOOD  CBC    EKG None  Radiology No results found.  Procedures Procedures (including critical care time)  Medications Ordered in ED Medications  ondansetron (ZOFRAN) injection 4 mg (4 mg Intravenous Given 01/26/20 1014)  sodium chloride 0.9 % bolus 500 mL (0 mLs Intravenous Stopping Infusion hung by another clincian 01/26/20 1125)    ED Course  I have reviewed the triage vital signs and the nursing notes.  Pertinent labs & imaging results that were available during my care of the patient were reviewed by me and considered in my medical decision making (see chart for details).  Clinical Course as of 01/26/20 1244  Sun Jan 26, 2020  1147 Glucose(!): 142 [CA]  1147 Ketones, ur(!): 5 [CA]    Clinical Course User Index [CA] Suzy Bouchard, PA-C   MDM Rules/Calculators/A&P                         69 year old female presents to the ED due to a near syncopal episode prior to arrival due to severe abdominal pain associated nausea, vomiting, and diarrhea.  Patient denies hematochezia, melena, and hematemesis.  No fever or chills.  Denies sick contacts.  Upon arrival, vitals all within normal limits.  Patient in no acute distress.  Physical exam reassuring.  Abdomen soft, nondistended, nontender.  Low suspicion for acute abdomen.  Will obtain routine labs to  check for electrolyte abnormalities and infectious etiologies.  IV Zofran and IV fluids given for symptomatic relief.  CBC unremarkable no leukocytosis and normal hemoglobin.  Lipase normal at 37.  Doubt pancreatitis.  CMP significant for mild hyperglycemia at 142 with no anion gap.  Doubt DKA.  Normal renal function.  No major electrolyte derangements.  UA significant for mild ketonuria, but no signs of infection.  12:42 PM reassessed patient at bedside he notes complete resolution in symptoms after Zofran and fluids.  Patient states she is ready to go home.  Abdomen soft, nondistended, nontender.  Low suspicion for acute abdomen.  Suspect symptoms had a near vasovagal.  Patient has been abdominal pain-free during her entire ED stay.  Will discharge patient with Zofran as needed for nausea.  Instructed patient to follow-up PCP within next week for further evaluation. Strict ED precautions discussed with patient. Patient states understanding and agrees to plan. Patient discharged home in no acute distress and stable vitals  Discussed case with Dr. Eulis Foster who evaluated patient at bedside and agrees with assessment and plan.  Final Clinical Impression(s) / ED Diagnoses Final diagnoses:  Generalized abdominal pain  Non-intractable vomiting with nausea, unspecified vomiting type    Rx / DC Orders ED Discharge Orders         Ordered    ondansetron (ZOFRAN ODT) 4 MG disintegrating tablet  Every 8 hours PRN        01/26/20 1243           Suzy Bouchard, PA-C 01/26/20 1301    Daleen Bo, MD 01/26/20 1712

## 2020-01-26 NOTE — ED Triage Notes (Signed)
Pt brought to ED via [facility] for abdominal pain and nausea. Pt was at church and felt pain in her abd, felt like she was going to pass out, felt nauseated and vomited a couple of times. CBG 147. Pt denies pain at this time

## 2020-01-26 NOTE — ED Provider Notes (Signed)
  Face-to-face evaluation   History: She is here for evaluation of.  Near syncope while walking to the bathroom after having cramping abdominal pain at church.  She feels back to normal now, time evaluated her, 12:05 PM.  Physical exam: Alert elderly female who is calm comfortable.  She developed some nausea after an episode of near syncope, but has otherwise improved.  I suspect that she has had a vasovagal episode.  Medical screening examination/treatment/procedure(s) were conducted as a shared visit with non-physician practitioner(s) and myself.  I personally evaluated the patient during the encounter    Daleen Bo, MD 01/26/20 1711

## 2020-01-26 NOTE — Discharge Instructions (Addendum)
As discussed, all of your labs are reassuring today.  I am sending home with nausea medication.  Take as needed for nausea.  Please follow-up with PCP within the next week for further evaluation.  Return to the ER for any worsening symptoms.

## 2020-01-26 NOTE — ED Notes (Signed)
Continues to await re-eval and dispo

## 2020-01-26 NOTE — ED Notes (Signed)
Pt awaiting re-eval and dispo  Has friend who will bring home and will need to call

## 2020-03-17 ENCOUNTER — Other Ambulatory Visit: Payer: 59

## 2020-03-19 ENCOUNTER — Other Ambulatory Visit: Payer: 59

## 2020-04-17 DIAGNOSIS — H40813 Glaucoma with increased episcleral venous pressure, bilateral: Secondary | ICD-10-CM | POA: Diagnosis not present

## 2020-06-09 DIAGNOSIS — Z79899 Other long term (current) drug therapy: Secondary | ICD-10-CM | POA: Diagnosis not present

## 2020-06-09 DIAGNOSIS — E785 Hyperlipidemia, unspecified: Secondary | ICD-10-CM | POA: Diagnosis not present

## 2020-06-09 DIAGNOSIS — R69 Illness, unspecified: Secondary | ICD-10-CM | POA: Diagnosis not present

## 2020-06-09 DIAGNOSIS — R7301 Impaired fasting glucose: Secondary | ICD-10-CM | POA: Diagnosis not present

## 2020-06-09 DIAGNOSIS — E042 Nontoxic multinodular goiter: Secondary | ICD-10-CM | POA: Diagnosis not present

## 2020-06-16 DIAGNOSIS — R7301 Impaired fasting glucose: Secondary | ICD-10-CM | POA: Diagnosis not present

## 2020-06-16 DIAGNOSIS — E042 Nontoxic multinodular goiter: Secondary | ICD-10-CM | POA: Diagnosis not present

## 2020-06-16 DIAGNOSIS — G9009 Other idiopathic peripheral autonomic neuropathy: Secondary | ICD-10-CM | POA: Diagnosis not present

## 2020-06-16 DIAGNOSIS — Z681 Body mass index (BMI) 19 or less, adult: Secondary | ICD-10-CM | POA: Diagnosis not present

## 2020-06-16 DIAGNOSIS — Z0000 Encounter for general adult medical examination without abnormal findings: Secondary | ICD-10-CM | POA: Diagnosis not present

## 2020-06-16 DIAGNOSIS — E785 Hyperlipidemia, unspecified: Secondary | ICD-10-CM | POA: Diagnosis not present

## 2020-07-02 ENCOUNTER — Encounter: Payer: Self-pay | Admitting: *Deleted

## 2020-09-03 DIAGNOSIS — R208 Other disturbances of skin sensation: Secondary | ICD-10-CM | POA: Diagnosis not present

## 2020-09-03 DIAGNOSIS — X32XXXD Exposure to sunlight, subsequent encounter: Secondary | ICD-10-CM | POA: Diagnosis not present

## 2020-09-03 DIAGNOSIS — Z1283 Encounter for screening for malignant neoplasm of skin: Secondary | ICD-10-CM | POA: Diagnosis not present

## 2020-09-03 DIAGNOSIS — D225 Melanocytic nevi of trunk: Secondary | ICD-10-CM | POA: Diagnosis not present

## 2020-09-03 DIAGNOSIS — L570 Actinic keratosis: Secondary | ICD-10-CM | POA: Diagnosis not present

## 2020-09-05 DIAGNOSIS — Z1231 Encounter for screening mammogram for malignant neoplasm of breast: Secondary | ICD-10-CM | POA: Diagnosis not present

## 2020-11-17 ENCOUNTER — Ambulatory Visit: Payer: 59 | Admitting: Gastroenterology

## 2020-11-23 DIAGNOSIS — M9905 Segmental and somatic dysfunction of pelvic region: Secondary | ICD-10-CM | POA: Diagnosis not present

## 2020-11-23 DIAGNOSIS — M9902 Segmental and somatic dysfunction of thoracic region: Secondary | ICD-10-CM | POA: Diagnosis not present

## 2020-11-23 DIAGNOSIS — M9903 Segmental and somatic dysfunction of lumbar region: Secondary | ICD-10-CM | POA: Diagnosis not present

## 2020-11-23 DIAGNOSIS — M62830 Muscle spasm of back: Secondary | ICD-10-CM | POA: Diagnosis not present

## 2020-11-25 DIAGNOSIS — M9902 Segmental and somatic dysfunction of thoracic region: Secondary | ICD-10-CM | POA: Diagnosis not present

## 2020-11-25 DIAGNOSIS — M9903 Segmental and somatic dysfunction of lumbar region: Secondary | ICD-10-CM | POA: Diagnosis not present

## 2020-11-25 DIAGNOSIS — M62830 Muscle spasm of back: Secondary | ICD-10-CM | POA: Diagnosis not present

## 2020-11-25 DIAGNOSIS — M9905 Segmental and somatic dysfunction of pelvic region: Secondary | ICD-10-CM | POA: Diagnosis not present

## 2020-11-30 DIAGNOSIS — M9905 Segmental and somatic dysfunction of pelvic region: Secondary | ICD-10-CM | POA: Diagnosis not present

## 2020-11-30 DIAGNOSIS — M9902 Segmental and somatic dysfunction of thoracic region: Secondary | ICD-10-CM | POA: Diagnosis not present

## 2020-11-30 DIAGNOSIS — M62830 Muscle spasm of back: Secondary | ICD-10-CM | POA: Diagnosis not present

## 2020-11-30 DIAGNOSIS — M9903 Segmental and somatic dysfunction of lumbar region: Secondary | ICD-10-CM | POA: Diagnosis not present

## 2020-12-02 DIAGNOSIS — M9903 Segmental and somatic dysfunction of lumbar region: Secondary | ICD-10-CM | POA: Diagnosis not present

## 2020-12-02 DIAGNOSIS — M62830 Muscle spasm of back: Secondary | ICD-10-CM | POA: Diagnosis not present

## 2020-12-02 DIAGNOSIS — M9905 Segmental and somatic dysfunction of pelvic region: Secondary | ICD-10-CM | POA: Diagnosis not present

## 2020-12-02 DIAGNOSIS — M9902 Segmental and somatic dysfunction of thoracic region: Secondary | ICD-10-CM | POA: Diagnosis not present

## 2020-12-11 DIAGNOSIS — M62830 Muscle spasm of back: Secondary | ICD-10-CM | POA: Diagnosis not present

## 2020-12-11 DIAGNOSIS — M9902 Segmental and somatic dysfunction of thoracic region: Secondary | ICD-10-CM | POA: Diagnosis not present

## 2020-12-11 DIAGNOSIS — M9903 Segmental and somatic dysfunction of lumbar region: Secondary | ICD-10-CM | POA: Diagnosis not present

## 2020-12-11 DIAGNOSIS — M9905 Segmental and somatic dysfunction of pelvic region: Secondary | ICD-10-CM | POA: Diagnosis not present

## 2020-12-23 DIAGNOSIS — M9905 Segmental and somatic dysfunction of pelvic region: Secondary | ICD-10-CM | POA: Diagnosis not present

## 2020-12-23 DIAGNOSIS — M62830 Muscle spasm of back: Secondary | ICD-10-CM | POA: Diagnosis not present

## 2020-12-23 DIAGNOSIS — M9903 Segmental and somatic dysfunction of lumbar region: Secondary | ICD-10-CM | POA: Diagnosis not present

## 2020-12-23 DIAGNOSIS — M9902 Segmental and somatic dysfunction of thoracic region: Secondary | ICD-10-CM | POA: Diagnosis not present

## 2021-01-13 DIAGNOSIS — M9902 Segmental and somatic dysfunction of thoracic region: Secondary | ICD-10-CM | POA: Diagnosis not present

## 2021-01-13 DIAGNOSIS — M62830 Muscle spasm of back: Secondary | ICD-10-CM | POA: Diagnosis not present

## 2021-01-13 DIAGNOSIS — M9905 Segmental and somatic dysfunction of pelvic region: Secondary | ICD-10-CM | POA: Diagnosis not present

## 2021-01-13 DIAGNOSIS — M9903 Segmental and somatic dysfunction of lumbar region: Secondary | ICD-10-CM | POA: Diagnosis not present

## 2021-02-12 DIAGNOSIS — R69 Illness, unspecified: Secondary | ICD-10-CM | POA: Diagnosis not present

## 2021-02-12 DIAGNOSIS — M899 Disorder of bone, unspecified: Secondary | ICD-10-CM | POA: Diagnosis not present

## 2021-02-12 DIAGNOSIS — F909 Attention-deficit hyperactivity disorder, unspecified type: Secondary | ICD-10-CM | POA: Diagnosis not present

## 2021-06-11 DIAGNOSIS — G9009 Other idiopathic peripheral autonomic neuropathy: Secondary | ICD-10-CM | POA: Diagnosis not present

## 2021-06-11 DIAGNOSIS — R7301 Impaired fasting glucose: Secondary | ICD-10-CM | POA: Diagnosis not present

## 2021-06-11 DIAGNOSIS — E785 Hyperlipidemia, unspecified: Secondary | ICD-10-CM | POA: Diagnosis not present

## 2021-06-11 DIAGNOSIS — Z79899 Other long term (current) drug therapy: Secondary | ICD-10-CM | POA: Diagnosis not present

## 2021-06-11 DIAGNOSIS — E042 Nontoxic multinodular goiter: Secondary | ICD-10-CM | POA: Diagnosis not present

## 2021-06-18 DIAGNOSIS — E785 Hyperlipidemia, unspecified: Secondary | ICD-10-CM | POA: Diagnosis not present

## 2021-06-18 DIAGNOSIS — R079 Chest pain, unspecified: Secondary | ICD-10-CM | POA: Diagnosis not present

## 2021-06-18 DIAGNOSIS — Z681 Body mass index (BMI) 19 or less, adult: Secondary | ICD-10-CM | POA: Diagnosis not present

## 2021-06-18 DIAGNOSIS — R7309 Other abnormal glucose: Secondary | ICD-10-CM | POA: Diagnosis not present

## 2021-06-18 DIAGNOSIS — E042 Nontoxic multinodular goiter: Secondary | ICD-10-CM | POA: Diagnosis not present

## 2021-06-18 DIAGNOSIS — E039 Hypothyroidism, unspecified: Secondary | ICD-10-CM | POA: Diagnosis not present

## 2021-07-22 ENCOUNTER — Ambulatory Visit: Payer: Medicare HMO | Admitting: "Endocrinology

## 2021-07-22 ENCOUNTER — Encounter: Payer: Self-pay | Admitting: "Endocrinology

## 2021-07-22 VITALS — BP 118/76 | HR 60 | Ht 62.25 in | Wt 100.8 lb

## 2021-07-22 DIAGNOSIS — R7989 Other specified abnormal findings of blood chemistry: Secondary | ICD-10-CM

## 2021-07-22 NOTE — Progress Notes (Signed)
Endocrinology Consult Note                                            07/22/2021, 12:49 PM   Subjective:    Patient ID: Lisa Cisneros, female    DOB: 1950/04/03, PCP Asencion Noble, MD   Past Medical History:  Diagnosis Date   Goiter    Pneumonia    Rotator cuff syndrome of right shoulder 07/24/2012   Past Surgical History:  Procedure Laterality Date   CHOLECYSTECTOMY     Social History   Socioeconomic History   Marital status: Divorced    Spouse name: Not on file   Number of children: 1   Years of education: Not on file   Highest education level: Not on file  Occupational History   Occupation: works at [employer]  Tobacco Use   Smoking status: Never   Smokeless tobacco: Never  Vaping Use   Vaping Use: Never used  Substance and Sexual Activity   Alcohol use: Not Currently    Comment: 3 glass in a week   Drug use: No   Sexual activity: Not on file  Other Topics Concern   Not on file  Social History Narrative   Not on file   Social Determinants of Health   Financial Resource Strain: Not on file  Food Insecurity: Not on file  Transportation Needs: Not on file  Physical Activity: Not on file  Stress: Not on file  Social Connections: Not on file   Family History  Problem Relation Age of Onset   Thyroid disease Mother    Alzheimer's disease Mother    Hyperlipidemia Mother    Hyperlipidemia Father    Heart attack Father    Colon cancer Neg Hx    Liver disease Neg Hx    Outpatient Encounter Medications as of 07/22/2021  Medication Sig   Calcium Carb-Cholecalciferol (CALCIUM 500 + D PO) Take 2 tablets by mouth daily.   CINNAMON PO Take 2 tablets by mouth daily.   Magnesium 250 MG TABS Take 1 tablet by mouth daily.   Zinc 50 MG TABS Take 1 tablet by mouth daily.   Cyanocobalamin (VITAMIN B-12) 500 MCG SUBL Place under the tongue.   fish oil-omega-3 fatty acids 1000 MG capsule Take 2 g by mouth daily.   Melatonin 10 MG CAPS Take 10 mg by mouth daily.    [DISCONTINUED] Acetylcarnitine HCl (ACETYL L-CARNITINE) 500 MG CAPS Take by mouth.   [DISCONTINUED] ALPHA LIPOIC ACID PO Take 600 mg by mouth daily.   [DISCONTINUED] Borage Oil-GLA-Linoleic Acid (BORAGE PO) Take 800 mg by mouth daily.   [DISCONTINUED] Cholecalciferol (VITAMIN D-3 PO) Take 2,000 Units by mouth daily.   [DISCONTINUED] dicyclomine (BENTYL) 10 MG capsule Take 1 capsule (10 mg total) by mouth 3 (three) times daily as needed.   [DISCONTINUED] Flaxseed, Linseed, (FLAX PO) Take 800 mg by mouth daily.   [DISCONTINUED] folic acid (FOLVITE) 737 MCG tablet Take 400 mcg by mouth daily.   [DISCONTINUED] Kelp 225 MCG TABS Take by mouth.   [DISCONTINUED] levofloxacin (LEVAQUIN) 500 MG tablet Take 1 tablet (500 mg total) by mouth daily.   [DISCONTINUED] methocarbamol (ROBAXIN) 500 MG tablet Take 1 tablet (500 mg total) by mouth 3 (three) times daily.   [DISCONTINUED] MINIVELLE 0.075 MG/24HR Place 1 patch onto the skin 2 (two) times a week.    [  DISCONTINUED] ondansetron (ZOFRAN ODT) 4 MG disintegrating tablet Take 1 tablet (4 mg total) by mouth every 8 (eight) hours as needed for nausea.   [DISCONTINUED] ondansetron (ZOFRAN ODT) 4 MG disintegrating tablet Take 1 tablet (4 mg total) by mouth every 8 (eight) hours as needed for nausea or vomiting.   [DISCONTINUED] Probiotic Product (ALIGN) 4 MG CAPS Take 4 mg by mouth daily.   [DISCONTINUED] progesterone (PROMETRIUM) 200 MG capsule Take 200 mg by mouth every 3 (three) months.   [DISCONTINUED] traMADol (ULTRAM) 50 MG tablet Take 1 tablet (50 mg total) by mouth every 6 (six) hours as needed.   No facility-administered encounter medications on file as of 07/22/2021.   ALLERGIES: Allergies  Allergen Reactions   Codeine Nausea And Vomiting   Erythromycin Ethylsuccinate Other (See Comments)    Severe hunger pains   Latex Rash    VACCINATION STATUS:  There is no immunization history on file for this patient.  HPI Lisa Cisneros is 71 y.o. female  who presents today with a medical history as above. she is being seen in consultation for hyperthyroidism requested by Asencion Noble, MD.   History is obtained directly from the patient.  On her routine labs she was found to have suppressed TSH of 0.214.  She is not on antithyroid medications nor thyroid hormone supplements.  She also has a remote past history of multinodular goiter.  She does not have recent thyroid ultrasound.  She has noticed increased level of anxiety, increased appetite associated with some weight gain although she is still weighs 100 pounds. She has family history of thyroid dysfunction in her mother as well as in her son.  She denies any family history of thyroid malignancy.  She is on multiple supplements including calcium, magnesium, fish oil, B12, cinnamon, zinc, Melatonin. 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 She denies coronary artery disease.  She denies hyperlipidemia, however, her medical records show hyperlipidemia with LDL of 134, however she is not on statins.   Review of Systems  Constitutional: no recent weight gain/loss, +fatigue, no subjective hyperthermia, no subjective hypothermia Eyes: no blurry vision, no xerophthalmia ENT: no sore throat, no nodules palpated in throat, no dysphagia/odynophagia, no hoarseness Cardiovascular: no Chest Pain, no Shortness of Breath, + palpitations, no leg swelling Respiratory: no cough, no shortness of breath Gastrointestinal: no Nausea/Vomiting/Diarhhea Musculoskeletal: no muscle/joint aches Skin: no rashes Neurological: + tremors, no numbness, no tingling, no dizziness Psychiatric: no depression, + anxiety  Objective:       07/22/2021    9:31 AM 01/26/2020   12:28 PM 01/26/2020   12:00 PM  Vitals with BMI  Height 5' 2.25"    Weight 100 lbs 13 oz    BMI 99.35    Systolic 701 779 390  Diastolic 76 59 62  Pulse 60 62 60    BP 118/76   Pulse 60   Ht 5' 2.25" (1.581 m)   Wt  100 lb 12.8 oz (45.7 kg)   BMI 18.29 kg/m   Wt Readings from Last 3 Encounters:  07/22/21 100 lb 12.8 oz (45.7 kg)  01/26/20 100 lb (45.4 kg)  01/12/17 105 lb (47.6 kg)    Physical Exam  Constitutional:  Body mass index is 18.29 kg/m.,  + Slightly anxious state of mind.    Eyes: PERRLA, EOMI, no exophthalmos ENT: moist mucous membranes, no gross thyromegaly, no gross cervical lymphadenopathy Cardiovascular: normal precordial activity, Regular Rate and Rhythm, no Murmur/Rubs/Gallops Respiratory:  adequate breathing  efforts, no gross chest deformity, Clear to auscultation bilaterally Gastrointestinal: abdomen soft, Non -tender, No distension, Bowel Sounds present, no gross organomegaly Musculoskeletal: no gross deformities, strength intact in all four extremities Skin: moist, warm, no rashes Neurological: + tremor with outstretched hands, Deep tendon reflexes normal in bilateral lower extremities.  CMP ( most recent) CMP     Component Value Date/Time   NA 136 01/26/2020 1011   NA 142 04/13/2011 1319   K 3.6 01/26/2020 1011   K 4.0 04/13/2011 1319   CL 102 01/26/2020 1011   CO2 23 01/26/2020 1011   GLUCOSE 142 (H) 01/26/2020 1011   BUN 17 01/26/2020 1011   BUN 13 04/13/2011 1319   CREATININE 0.66 01/26/2020 1011   CREATININE 0.67 04/13/2011 1319   CALCIUM 8.9 01/26/2020 1011   PROT 6.5 01/26/2020 1011   ALBUMIN 3.7 01/26/2020 1011   ALBUMIN 3.8 04/13/2011 1319   AST 25 01/26/2020 1011   AST 20 04/13/2011 1319   ALT 20 01/26/2020 1011   ALKPHOS 54 01/26/2020 1011   ALKPHOS 52 04/13/2011 1319   BILITOT 0.7 01/26/2020 1011   BILITOT 0.5 04/13/2011 1319   GFRNONAA >60 01/26/2020 1011   GFRAA >60 11/03/2014 2130    Her referral package includes a note with TSH of 0.214.  Lab Results  Component Value Date   TSH 2.17 04/13/2011     Assessment & Plan:   1. Abnormal thyroid blood test   - Lisa Cisneros  is being seen at a kind request of Asencion Noble, MD. - I have  reviewed her available thyroid records and clinically evaluated the patient. - Based on these reviews, she has suppressed TSH,  however,  there is not sufficient information to proceed with definitive treatment plan.  - she will need a repeat,  more complete thyroid function test towards confirming the diagnosis.  She will be sent to lab today to determine: TSH - T4, free - T3, free - Thyroid peroxidase antibody - Thyroglobulin antibody  If her labs are indicative of primary hyperthyroidism, she will be considered for thyroid uptake and scan.  Her clinical exam is negative for goiter, will defer the need for thyroid ultrasound for subsequent visits.   - I did not initiate any new prescriptions today. - she is advised to maintain close follow up with Asencion Noble, MD for primary care needs.   - Time spent with the patient: 50 minutes, of which >50% was spent in  counseling her about her abnormal thyroid function tests and the rest in obtaining information about her symptoms, reviewing her previous labs/studies ( including abstractions from other facilities),  evaluations, and treatments,  and developing a plan to confirm diagnosis and long term treatment based on the latest standards of care/guidelines; and documenting her care.  Lisa Cisneros participated in the discussions, expressed understanding, and voiced agreement with the above plans.  All questions were answered to her satisfaction. she is encouraged to contact clinic should she have any questions or concerns prior to her return visit.  Follow up plan: Return in about 1 week (around 07/29/2021) for Labs Today- Non-Fasting Ok.   Glade Lloyd, MD [facility] [address] [place], [place] [address] Phone: [phone]  Fax: [phone]     07/22/2021, 12:49 PM  This note was partially dictated with voice recognition software. Similar sounding words can be transcribed  inadequately or may not  be corrected upon review.

## 2021-07-23 LAB — T4, FREE: Free T4: 1.19 ng/dL (ref 0.82–1.77)

## 2021-07-23 LAB — THYROID PEROXIDASE ANTIBODY: Thyroperoxidase Ab SerPl-aCnc: <9 IU/mL (ref 0–34)

## 2021-07-23 LAB — T3, FREE: T3, Free: 3.1 pg/mL (ref 2.0–4.4)

## 2021-07-23 LAB — THYROGLOBULIN ANTIBODY: Thyroglobulin Antibody: <1 IU/mL (ref 0.0–0.9)

## 2021-07-23 LAB — TSH: TSH: 0.132 u[IU]/mL — ABNORMAL LOW (ref 0.450–4.500)

## 2021-07-29 ENCOUNTER — Encounter: Payer: Self-pay | Admitting: "Endocrinology

## 2021-07-29 ENCOUNTER — Ambulatory Visit: Payer: Medicare HMO | Admitting: "Endocrinology

## 2021-07-29 VITALS — BP 122/64 | HR 60 | Ht 62.25 in | Wt 101.2 lb

## 2021-07-29 DIAGNOSIS — R7989 Other specified abnormal findings of blood chemistry: Secondary | ICD-10-CM

## 2021-07-29 NOTE — Progress Notes (Signed)
07/29/2021, 4:55 PM  Endocrinology follow-up note   Subjective:    Patient ID: Lisa Cisneros, female    DOB: 09/04/1950, PCP Asencion Noble, MD   Past Medical History:  Diagnosis Date   Goiter    Pneumonia    Rotator cuff syndrome of right shoulder 07/24/2012   Past Surgical History:  Procedure Laterality Date   CHOLECYSTECTOMY     Social History   Socioeconomic History   Marital status: Divorced    Spouse name: Not on file   Number of children: 1   Years of education: Not on file   Highest education level: Not on file  Occupational History   Occupation: works at [employer]  Tobacco Use   Smoking status: Never   Smokeless tobacco: Never  Vaping Use   Vaping Use: Never used  Substance and Sexual Activity   Alcohol use: Not Currently    Comment: 3 glass in a week   Drug use: No   Sexual activity: Not on file  Other Topics Concern   Not on file  Social History Narrative   Not on file   Social Determinants of Health   Financial Resource Strain: Not on file  Food Insecurity: Not on file  Transportation Needs: Not on file  Physical Activity: Not on file  Stress: Not on file  Social Connections: Not on file   Family History  Problem Relation Age of Onset   Thyroid disease Mother    Alzheimer's disease Mother    Hyperlipidemia Mother    Hyperlipidemia Father    Heart attack Father    Colon cancer Neg Hx    Liver disease Neg Hx    Outpatient Encounter Medications as of 07/29/2021  Medication Sig   Apoaequorin (PREVAGEN EXTRA STRENGTH PO) Take 1 tablet by mouth daily.   Multiple Vitamins-Minerals (EYE VITAMINS PO) Take 1 tablet by mouth daily.   Calcium Carb-Cholecalciferol (CALCIUM 500 + D PO) Take 2 tablets by mouth daily.   CINNAMON PO Take 2 tablets by mouth daily.   Cyanocobalamin (VITAMIN B-12) 500 MCG SUBL Place under the tongue.   fish oil-omega-3 fatty acids 1000 MG capsule Take 2 g by mouth daily.    Magnesium 250 MG TABS Take 1 tablet by mouth daily.   Melatonin 10 MG CAPS Take 10 mg by mouth daily.   Zinc 50 MG TABS Take 1 tablet by mouth daily.   No facility-administered encounter medications on file as of 07/29/2021.   ALLERGIES: Allergies  Allergen Reactions   Codeine Nausea And Vomiting   Erythromycin Ethylsuccinate Other (See Comments)    Severe hunger pains   Latex Rash    VACCINATION STATUS:  There is no immunization history on file for this patient.  HPI Lisa Cisneros is 71 y.o. female who presents today with a medical history as above. she is being seen in  follow up after she was seen in consultation for hyperthyroidism requested by Asencion Noble, MD.   History is obtained directly from the patient.  On her routine labs she was found to have suppressed TSH of 0.214.  She is not on antithyroid medications nor thyroid hormone supplements. Her subsequent labs show further suppression of TSH but  normal thyroid hormones.  She also has a remote past history of multinodular goiter.  She does not have recent thyroid ultrasound.  She has noticed increased level of anxiety, increased appetite associated with some weight gain although she still weighs 100 pounds. She has family history of thyroid dysfunction in her mother as well as in her son.  She denies any family history of thyroid malignancy.  She is on multiple supplements including calcium, magnesium, fish oil, B12, cinnamon, zinc, Melatonin. 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 She denies coronary artery disease.  She denies hyperlipidemia, however, her medical records show hyperlipidemia with LDL of 134, however she is not on statins.   Review of Systems  Constitutional: no recent weight gain/loss, +fatigue, no subjective hyperthermia, no subjective hypothermia Eyes: no blurry vision, no xerophthalmia ENT: no sore throat, no nodules palpated in throat, no dysphagia/odynophagia, no  hoarseness Cardiovascular: no Chest Pain, no Shortness of Breath, + palpitations, no leg swelling Respiratory: no cough, no shortness of breath Gastrointestinal: no Nausea/Vomiting/Diarhhea Musculoskeletal: no muscle/joint aches Skin: no rashes Neurological: + tremors, no numbness, no tingling, no dizziness Psychiatric: no depression, + anxiety  Objective:       07/29/2021    8:51 AM 07/22/2021    9:31 AM 01/26/2020   12:28 PM  Vitals with BMI  Height 5' 2.25" 5' 2.25"   Weight 101 lbs 3 oz 100 lbs 13 oz   BMI 55.73 22.02   Systolic 542 706 237  Diastolic 64 76 59  Pulse 60 60 62    BP 122/64   Pulse 60   Ht 5' 2.25" (1.581 m)   Wt 101 lb 3.2 oz (45.9 kg)   BMI 18.36 kg/m   Wt Readings from Last 3 Encounters:  07/29/21 101 lb 3.2 oz (45.9 kg)  07/22/21 100 lb 12.8 oz (45.7 kg)  01/26/20 100 lb (45.4 kg)    Physical Exam  Constitutional:  Body mass index is 18.36 kg/m.,  + Slightly anxious state of mind.    Eyes: PERRLA, EOMI, no exophthalmos ENT: moist mucous membranes, no gross thyromegaly, no gross cervical lymphadenopathy Cardiovascular: normal precordial activity, Regular Rate and Rhythm, no Murmur/Rubs/Gallops Respiratory:  adequate breathing efforts, no gross chest deformity, Clear to auscultation bilaterally Gastrointestinal: abdomen soft, Non -tender, No distension, Bowel Sounds present, no gross organomegaly Musculoskeletal: no gross deformities, strength intact in all four extremities Skin: moist, warm, no rashes Neurological: + tremor with outstretched hands, Deep tendon reflexes normal in bilateral lower extremities.  CMP ( most recent) CMP     Component Value Date/Time   NA 136 01/26/2020 1011   NA 142 04/13/2011 1319   K 3.6 01/26/2020 1011   K 4.0 04/13/2011 1319   CL 102 01/26/2020 1011   CO2 23 01/26/2020 1011   GLUCOSE 142 (H) 01/26/2020 1011   BUN 17 01/26/2020 1011   BUN 13 04/13/2011 1319   CREATININE 0.66 01/26/2020 1011   CREATININE  0.67 04/13/2011 1319   CALCIUM 8.9 01/26/2020 1011   PROT 6.5 01/26/2020 1011   ALBUMIN 3.7 01/26/2020 1011   ALBUMIN 3.8 04/13/2011 1319   AST 25 01/26/2020 1011   AST 20 04/13/2011 1319   ALT 20 01/26/2020 1011   ALKPHOS 54 01/26/2020 1011   ALKPHOS 52 04/13/2011 1319   BILITOT 0.7 01/26/2020 1011   BILITOT 0.5 04/13/2011 1319   GFRNONAA >60 01/26/2020 1011   GFRAA >60 11/03/2014 2130    Her referral package includes a note with  TSH of 0.214.  Lab Results  Component Value Date   TSH 0.132 (L) 07/22/2021   TSH 2.17 04/13/2011   FREET4 1.19 07/22/2021     Assessment & Plan:   1. Abnormal thyroid blood test   - I have reviewed her  recent and existing  thyroid records and clinically evaluated the patient. - Based on these reviews, she has persistently suppressed TSH. She will need further work up with thyroid uptake and scan to confirm thyroid activity.    If her study confirms primary hyperthyroidism, she will be considered for definitive therapy to achieve target thyroid function.  l  Her clinical exam is negative for goiter, will defer the need for thyroid ultrasound for subsequent visits.   - I did not initiate any new prescriptions today. - she is advised to maintain close follow up with Asencion Noble, MD for primary care needs.    I spent 21 minutes in the care of the patient today including review of labs from Thyroid Function, CMP, and other relevant labs ; imaging/biopsy records (current and previous including abstractions from other facilities); face-to-face time discussing  her lab results and symptoms, medications doses, her options of short and long term treatment based on the latest standards of care / guidelines;   and documenting the encounter.  Lisa Cisneros  participated in the discussions, expressed understanding, and voiced agreement with the above plans.  All questions were answered to her satisfaction. she is encouraged to contact clinic should she  have any questions or concerns prior to her return visit.   Follow up plan: Return in about 2 weeks (around 08/12/2021) for F/U with Thyroid Uptake and Scan.   Glade Lloyd, MD [facility] [address] [place], [place] [address] Phone: [phone]  Fax: [phone]     07/29/2021, 4:55 PM  This note was partially dictated with voice recognition software. Similar sounding words can be transcribed inadequately or may not  be corrected upon review.

## 2021-08-03 ENCOUNTER — Other Ambulatory Visit (HOSPITAL_COMMUNITY): Payer: 59

## 2021-08-04 ENCOUNTER — Other Ambulatory Visit (HOSPITAL_COMMUNITY): Payer: 59

## 2021-08-10 ENCOUNTER — Encounter (HOSPITAL_COMMUNITY)
Admission: RE | Admit: 2021-08-10 | Discharge: 2021-08-10 | Disposition: A | Discharge status: Home / Self Care | Payer: Medicare HMO | Source: Ambulatory Visit | Attending: "Endocrinology | Admitting: "Endocrinology

## 2021-08-10 DIAGNOSIS — R7989 Other specified abnormal findings of blood chemistry: Secondary | ICD-10-CM | POA: Insufficient documentation

## 2021-08-10 MED ORDER — SODIUM IODIDE I-123 7.4 MBQ CAPS
475.0000 | ORAL_CAPSULE | Freq: Once | ORAL | Status: AC
  Administered 2021-08-10: 475 via ORAL

## 2021-08-11 ENCOUNTER — Encounter (HOSPITAL_COMMUNITY)
Admission: RE | Admit: 2021-08-11 | Discharge: 2021-08-11 | Disposition: A | Discharge status: Home / Self Care | Payer: Medicare HMO | Source: Ambulatory Visit | Attending: "Endocrinology | Admitting: "Endocrinology

## 2021-08-11 DIAGNOSIS — R946 Abnormal results of thyroid function studies: Secondary | ICD-10-CM | POA: Diagnosis not present

## 2021-08-18 ENCOUNTER — Encounter: Payer: Self-pay | Admitting: "Endocrinology

## 2021-08-18 ENCOUNTER — Ambulatory Visit: Payer: Medicare HMO | Admitting: "Endocrinology

## 2021-08-18 VITALS — BP 104/70 | HR 56 | Ht 62.25 in | Wt 102.0 lb

## 2021-08-18 DIAGNOSIS — R7989 Other specified abnormal findings of blood chemistry: Secondary | ICD-10-CM | POA: Diagnosis not present

## 2021-08-18 NOTE — Progress Notes (Signed)
08/18/2021, 12:54 PM  Endocrinology follow-up note   Subjective:    Patient ID: Lisa Cisneros, female    DOB: [birth year], PCP Asencion Noble, MD   Past Medical History:  Diagnosis Date   Goiter    Pneumonia    Rotator cuff syndrome of right shoulder 07/24/2012   Past Surgical History:  Procedure Laterality Date   CHOLECYSTECTOMY     Social History   Socioeconomic History   Marital status: Divorced    Spouse name: Not on file   Number of children: 1   Years of education: Not on file   Highest education level: Not on file  Occupational History   Occupation: works at [employer]  Tobacco Use   Smoking status: Never   Smokeless tobacco: Never  Vaping Use   Vaping Use: Never used  Substance and Sexual Activity   Alcohol use: Not Currently    Comment: 3 glass in a week   Drug use: No   Sexual activity: Not on file  Other Topics Concern   Not on file  Social History Narrative   Not on file   Social Determinants of Health   Financial Resource Strain: Not on file  Food Insecurity: Not on file  Transportation Needs: Not on file  Physical Activity: Not on file  Stress: Not on file  Social Connections: Not on file   Family History  Problem Relation Age of Onset   Thyroid disease Mother    Alzheimer's disease Mother    Hyperlipidemia Mother    Hyperlipidemia Father    Heart attack Father    Colon cancer Neg Hx    Liver disease Neg Hx    Outpatient Encounter Medications as of 08/18/2021  Medication Sig   acetaminophen (TYLENOL 8 HOUR ARTHRITIS PAIN) 650 MG CR tablet Take 650 mg by mouth every 8 (eight) hours as needed for pain.   Apoaequorin (PREVAGEN EXTRA STRENGTH PO) Take 1 tablet by mouth daily.   Calcium Carb-Cholecalciferol (CALCIUM 500 + D PO) Take 2 tablets by mouth daily.   CINNAMON PO Take 2 tablets by mouth daily.   Cyanocobalamin (VITAMIN B-12) 500 MCG SUBL Place under the tongue.   fish oil-omega-3 fatty  acids 1000 MG capsule Take 2 g by mouth daily.   Magnesium 250 MG TABS Take 1 tablet by mouth daily.   Melatonin 10 MG CAPS Take 10 mg by mouth daily.   Multiple Vitamins-Minerals (EYE VITAMINS PO) Take 1 tablet by mouth daily.   Zinc 50 MG TABS Take 1 tablet by mouth daily.   No facility-administered encounter medications on file as of 08/18/2021.   ALLERGIES: Allergies  Allergen Reactions   Codeine Nausea And Vomiting   Erythromycin Ethylsuccinate Other (See Comments)    Severe hunger pains   Latex Rash    VACCINATION STATUS:  There is no immunization history on file for this patient.  HPI Lisa Cisneros is 71 y.o. female who presents today with a medical history as above. she is being seen in  follow up after she was seen in consultation for hyperthyroidism requested by Asencion Noble, MD.   History is obtained directly from the patient.  On her routine labs she was found to  have suppressed TSH of 0.214.  Her repeat thyroid function tests continue to show suppressed TSH.  She was considered for thyroid uptake and scan after her last visit.  Uptake and scan showing high normal uptake of 28%.  Today, she discloses The Fact That She Was- taking kelp-thyroid supplement for several months before her lab work.     She also has a remote past history of multinodular goiter.  She does not have recent thyroid ultrasound.  She has noticed increased level of anxiety, increased appetite associated with some weight gain although she still weighs 100 -103 pounds. She has family history of thyroid dysfunction in her mother as well as in her son.  She denies any family history of thyroid malignancy.  She is on multiple supplements including calcium, magnesium, fish oil, B12, cinnamon, zinc, Melatonin. 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 She denies coronary artery disease.  She denies hyperlipidemia, however, her medical records show hyperlipidemia with LDL of 134,  however she is not on statins.   Review of Systems  Constitutional: no recent weight gain/loss, +fatigue, no subjective hyperthermia, no subjective hypothermia Eyes: no blurry vision, no xerophthalmia ENT: no sore throat, no nodules palpated in throat, no dysphagia/odynophagia, no hoarseness   Objective:       08/18/2021    9:14 AM 07/29/2021    8:51 AM 07/22/2021    9:31 AM  Vitals with BMI  Height 5' 2.25" 5' 2.25" 5' 2.25"  Weight 102 lbs 101 lbs 3 oz 100 lbs 13 oz  BMI 18.51 08.14 48.18  Systolic 563 149 702  Diastolic 70 64 76  Pulse 56 60 60    BP 104/70   Pulse (!) 56   Ht 5' 2.25" (1.581 m)   Wt 102 lb (46.3 kg)   BMI 18.51 kg/m   Wt Readings from Last 3 Encounters:  08/18/21 102 lb (46.3 kg)  07/29/21 101 lb 3.2 oz (45.9 kg)  07/22/21 100 lb 12.8 oz (45.7 kg)    Physical Exam  Constitutional:  Body mass index is 18.51 kg/m.,  + Slightly anxious state of mind.    Eyes: PERRLA, EOMI, no exophthalmos ENT: moist mucous membranes, no gross thyromegaly, no gross cervical lymphadenopathy Cardiovascular: normal precordial activity, Regular Rate and Rhythm, no Murmur/Rubs/Gallops   CMP ( most recent) CMP     Component Value Date/Time   NA 136 01/26/2020 1011   NA 142 04/13/2011 1319   K 3.6 01/26/2020 1011   K 4.0 04/13/2011 1319   CL 102 01/26/2020 1011   CO2 23 01/26/2020 1011   GLUCOSE 142 (H) 01/26/2020 1011   BUN 17 01/26/2020 1011   BUN 13 04/13/2011 1319   CREATININE 0.66 01/26/2020 1011   CREATININE 0.67 04/13/2011 1319   CALCIUM 8.9 01/26/2020 1011   PROT 6.5 01/26/2020 1011   ALBUMIN 3.7 01/26/2020 1011   ALBUMIN 3.8 04/13/2011 1319   AST 25 01/26/2020 1011   AST 20 04/13/2011 1319   ALT 20 01/26/2020 1011   ALKPHOS 54 01/26/2020 1011   ALKPHOS 52 04/13/2011 1319   BILITOT 0.7 01/26/2020 1011   BILITOT 0.5 04/13/2011 1319   GFRNONAA >60 01/26/2020 1011   GFRAA >60 11/03/2014 2130    Her referral package includes a note with TSH of  0.214.  Lab Results  Component Value Date   TSH 0.132 (L) 07/22/2021   TSH 2.17 04/13/2011   FREET4 1.19 07/22/2021    Thyroid uptake and scan on August 11, 2021 FINDINGS: Homogeneous tracer distribution in both thyroid lobes.   No focal areas of increased or decreased tracer uptake.   4 hour I-123 uptake = 8.7% (normal 5-20%),  24 hour I-123 uptake = 26.8% (normal 10-30%)   IMPRESSION: Normal exam.  Assessment & Plan:   1. Abnormal thyroid blood test I reviewed her new and existing thyroid work-up.  Her uptake and scan is negative for significant primary hyperthyroidism. - Based on these reviews, she has persistently suppressed TSH.  Cording to her new history she took  kelp to support her thyroid prior to her abnormal lab work which occasionally triggered the abnormal thyroid function test.    She would not be considered for ablative treatment at this time.  She is advised to stay off of all thyroid supplements and will have repeat lab work in 3 months and clinical reevaluation  If her labs are persistently showing suppressed TSH, she will be considered for low-dose methimazole intervention.  Her clinical exam is negative for goiter, will defer the need for thyroid ultrasound for subsequent visits.    - she is advised to maintain close follow up with Asencion Noble, MD for primary care needs.   I spent 21 minutes in the care of the patient today including review of labs from Thyroid Function, CMP, and other relevant labs ; imaging/biopsy records (current and previous including abstractions from other facilities); face-to-face time discussing  her lab results and symptoms, medications doses, her options of short and long term treatment based on the latest standards of care / guidelines;   and documenting the encounter.  Lisa Cisneros  participated in the discussions, expressed understanding, and voiced agreement with the above plans.  All questions were answered to her satisfaction.  she is encouraged to contact clinic should she have any questions or concerns prior to her return visit.    Follow up plan: Return in about 3 months (around 11/18/2021) for F/U with Pre-visit Labs.   Glade Lloyd, MD [facility] [address] [place], [place] [address] Phone: [phone]  Fax: [phone]     08/18/2021, 12:54 PM  This note was partially dictated with voice recognition software. Similar sounding words can be transcribed inadequately or may not  be corrected upon review.

## 2021-09-09 DIAGNOSIS — M8589 Other specified disorders of bone density and structure, multiple sites: Secondary | ICD-10-CM | POA: Diagnosis not present

## 2021-09-09 DIAGNOSIS — Z780 Asymptomatic menopausal state: Secondary | ICD-10-CM | POA: Diagnosis not present

## 2021-09-09 DIAGNOSIS — Z1231 Encounter for screening mammogram for malignant neoplasm of breast: Secondary | ICD-10-CM | POA: Diagnosis not present

## 2021-11-05 DIAGNOSIS — Z1159 Encounter for screening for other viral diseases: Secondary | ICD-10-CM | POA: Diagnosis not present

## 2021-11-11 DIAGNOSIS — R7989 Other specified abnormal findings of blood chemistry: Secondary | ICD-10-CM | POA: Diagnosis not present

## 2021-11-12 LAB — T4, FREE: Free T4: 1.13 ng/dL (ref 0.82–1.77)

## 2021-11-12 LAB — T3, FREE: T3, Free: 3.1 pg/mL (ref 2.0–4.4)

## 2021-11-12 LAB — TSH: TSH: 1.76 u[IU]/mL (ref 0.450–4.500)

## 2021-11-18 ENCOUNTER — Ambulatory Visit: Payer: Medicare HMO | Admitting: "Endocrinology

## 2021-11-18 ENCOUNTER — Encounter: Payer: Self-pay | Admitting: "Endocrinology

## 2021-11-18 VITALS — BP 110/68 | HR 56 | Ht 62.25 in | Wt 104.4 lb

## 2021-11-18 DIAGNOSIS — R7989 Other specified abnormal findings of blood chemistry: Secondary | ICD-10-CM

## 2021-11-18 NOTE — Progress Notes (Signed)
11/18/2021, 9:28 AM  Endocrinology follow-up note   Subjective:    Patient ID: Lisa Cisneros, female    DOB: [birth year], PCP Asencion Noble, MD   Past Medical History:  Diagnosis Date   Goiter    Pneumonia    Rotator cuff syndrome of right shoulder 07/24/2012   Past Surgical History:  Procedure Laterality Date   CHOLECYSTECTOMY     Social History   Socioeconomic History   Marital status: Divorced    Spouse name: Not on file   Number of children: 1   Years of education: Not on file   Highest education level: Not on file  Occupational History   Occupation: works at [employer]  Tobacco Use   Smoking status: Never   Smokeless tobacco: Never  Vaping Use   Vaping Use: Never used  Substance and Sexual Activity   Alcohol use: Not Currently    Comment: 3 glass in a week   Drug use: No   Sexual activity: Not on file  Other Topics Concern   Not on file  Social History Narrative   Not on file   Social Determinants of Health   Financial Resource Strain: Not on file  Food Insecurity: Not on file  Transportation Needs: Not on file  Physical Activity: Not on file  Stress: Not on file  Social Connections: Not on file   Family History  Problem Relation Age of Onset   Thyroid disease Mother    Alzheimer's disease Mother    Hyperlipidemia Mother    Hyperlipidemia Father    Heart attack Father    Colon cancer Neg Hx    Liver disease Neg Hx    Outpatient Encounter Medications as of 11/18/2021  Medication Sig   acetaminophen (TYLENOL 8 HOUR ARTHRITIS PAIN) 650 MG CR tablet Take 650 mg by mouth every 8 (eight) hours as needed for pain.   Apoaequorin (PREVAGEN EXTRA STRENGTH PO) Take 1 tablet by mouth daily.   Calcium Carb-Cholecalciferol (CALCIUM 500 + D PO) Take 2 tablets by mouth daily.   CINNAMON PO Take 2 tablets by mouth daily.   Cyanocobalamin (VITAMIN B-12) 500 MCG SUBL Place under the tongue.   fish oil-omega-3 fatty  acids 1000 MG capsule Take 2 g by mouth daily.   Magnesium 250 MG TABS Take 1 tablet by mouth daily.   Melatonin 10 MG CAPS Take 10 mg by mouth daily.   Zinc 50 MG TABS Take 1 tablet by mouth daily.   [DISCONTINUED] Multiple Vitamins-Minerals (EYE VITAMINS PO) Take 1 tablet by mouth daily.   No facility-administered encounter medications on file as of 11/18/2021.   ALLERGIES: Allergies  Allergen Reactions   Codeine Nausea And Vomiting   Erythromycin Ethylsuccinate Other (See Comments)    Severe hunger pains   Latex Rash    VACCINATION STATUS:  There is no immunization history on file for this patient.  HPI Lisa Cisneros is 71 y.o. female who presents today with a medical history as above. she is being seen in  follow up after she was seen in consultation for hyperthyroidism requested by Asencion Noble, MD.   History is obtained directly from the patient.  On her routine labs she was found  to have suppressed TSH of 0.214.  Her repeat thyroid function tests continue to show suppressed TSH.  She was considered for thyroid uptake and scan after her last visit.  Uptake and scan showing high normal uptake of 28%.  She was advised to stop taking "thyroid supplements" during her last visit.  Her previsit labs are consistent with euthyroid state.  She also has a remote past history of multinodular goiter.  She does not have recent thyroid ultrasound.  She has observed improvement in her previous symptoms of anxiety, increased appetite.   Her weight has stabilized at 102 pounds. She has family history of thyroid dysfunction in her mother as well as in her son.  She denies any family history of thyroid malignancy.  She is on multiple supplements including calcium, magnesium, fish oil, B12, cinnamon, zinc, Melatonin. 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 She denies coronary artery disease.  She denies hyperlipidemia, however, her medical records show hyperlipidemia  with LDL of 134, however she is not on statins.   Review of Systems  Constitutional: no recent weight gain/loss, +fatigue, no subjective hyperthermia, no subjective hypothermia Eyes: no blurry vision, no xerophthalmia ENT: no sore throat, no nodules palpated in throat, no dysphagia/odynophagia, no hoarseness   Objective:       11/18/2021    8:57 AM 08/18/2021    9:14 AM 07/29/2021    8:51 AM  Vitals with BMI  Height 5' 2.25" 5' 2.25" 5' 2.25"  Weight 104 lbs 6 oz 102 lbs 101 lbs 3 oz  BMI 18.95 22.63 33.54  Systolic 562 563 893  Diastolic 68 70 64  Pulse 56 56 60    BP 110/68   Pulse (!) 56   Ht 5' 2.25" (1.581 m)   Wt 104 lb 6.4 oz (47.4 kg)   BMI 18.94 kg/m   Wt Readings from Last 3 Encounters:  11/18/21 104 lb 6.4 oz (47.4 kg)  08/18/21 102 lb (46.3 kg)  07/29/21 101 lb 3.2 oz (45.9 kg)    Physical Exam  Constitutional:  Body mass index is 18.94 kg/m.,  + Slightly anxious state of mind.    Eyes: PERRLA, EOMI, no exophthalmos ENT: moist mucous membranes, no gross thyromegaly, no gross cervical lymphadenopathy Cardiovascular: normal precordial activity, Regular Rate and Rhythm, no Murmur/Rubs/Gallops   CMP ( most recent) CMP     Component Value Date/Time   NA 136 01/26/2020 1011   NA 142 04/13/2011 1319   K 3.6 01/26/2020 1011   K 4.0 04/13/2011 1319   CL 102 01/26/2020 1011   CO2 23 01/26/2020 1011   GLUCOSE 142 (H) 01/26/2020 1011   BUN 17 01/26/2020 1011   BUN 13 04/13/2011 1319   CREATININE 0.66 01/26/2020 1011   CREATININE 0.67 04/13/2011 1319   CALCIUM 8.9 01/26/2020 1011   PROT 6.5 01/26/2020 1011   ALBUMIN 3.7 01/26/2020 1011   ALBUMIN 3.8 04/13/2011 1319   AST 25 01/26/2020 1011   AST 20 04/13/2011 1319   ALT 20 01/26/2020 1011   ALKPHOS 54 01/26/2020 1011   ALKPHOS 52 04/13/2011 1319   BILITOT 0.7 01/26/2020 1011   BILITOT 0.5 04/13/2011 1319   GFRNONAA >60 01/26/2020 1011   GFRAA >60 11/03/2014 2130    Her referral package includes a  note with TSH of 0.214.  Lab Results  Component Value Date   TSH 1.760 11/11/2021   TSH 0.132 (L) 07/22/2021   TSH 2.17 04/13/2011   FREET4 1.13 11/11/2021  FREET4 1.19 07/22/2021    Thyroid uptake and scan on August 11, 2021 FINDINGS: Homogeneous tracer distribution in both thyroid lobes.   No focal areas of increased or decreased tracer uptake.   4 hour I-123 uptake = 8.7% (normal 5-20%),  24 hour I-123 uptake = 26.8% (normal 10-30%)   IMPRESSION: Normal exam.  Assessment & Plan:   1. Abnormal thyroid blood test--resolved. I reviewed her new and existing thyroid work-up.  At her last visit, her uptake and scan was negative for significant primary hyperthyroidism. -Her thyroid function test has returned back to normal after she stopped taking over-the-counter thyroid supplements.  She will not need any antithyroid intervention at this time.  She may need repeat thyroid function test in 1 year.   Her clinical exam is negative for goiter, will defer the need for thyroid ultrasound for subsequent visits.    - she is advised to maintain close follow up with Asencion Noble, MD for primary care needs.   I spent 21 minutes in the care of the patient today including review of labs from Thyroid Function, CMP, and other relevant labs ; imaging/biopsy records (current and previous including abstractions from other facilities); face-to-face time discussing  her lab results and symptoms, medications doses, her options of short and long term treatment based on the latest standards of care / guidelines;   and documenting the encounter.  Lisa Cisneros  participated in the discussions, expressed understanding, and voiced agreement with the above plans.  All questions were answered to her satisfaction. she is encouraged to contact clinic should she have any questions or concerns prior to her return visit.   Follow up plan: Return if symptoms worsen or fail to improve.   Glade Lloyd, MD [facility] [address] [place], [place] [address] Phone: [phone]  Fax: [phone]     11/18/2021, 9:28 AM  This note was partially dictated with voice recognition software. Similar sounding words can be transcribed inadequately or may not  be corrected upon review.

## 2021-11-19 DIAGNOSIS — H16223 Keratoconjunctivitis sicca, not specified as Sjogren's, bilateral: Secondary | ICD-10-CM | POA: Diagnosis not present

## 2021-11-19 DIAGNOSIS — H35363 Drusen (degenerative) of macula, bilateral: Secondary | ICD-10-CM | POA: Diagnosis not present

## 2021-11-19 DIAGNOSIS — H26493 Other secondary cataract, bilateral: Secondary | ICD-10-CM | POA: Diagnosis not present

## 2021-12-17 DIAGNOSIS — N959 Unspecified menopausal and perimenopausal disorder: Secondary | ICD-10-CM | POA: Insufficient documentation

## 2021-12-20 ENCOUNTER — Ambulatory Visit (INDEPENDENT_AMBULATORY_CARE_PROVIDER_SITE_OTHER): Payer: Medicare HMO | Admitting: Orthopedic Surgery

## 2021-12-20 ENCOUNTER — Ambulatory Visit (INDEPENDENT_AMBULATORY_CARE_PROVIDER_SITE_OTHER): Payer: Medicare HMO

## 2021-12-20 ENCOUNTER — Encounter: Payer: Self-pay | Admitting: Orthopedic Surgery

## 2021-12-20 VITALS — BP 135/74 | HR 59 | Ht 62.0 in | Wt 102.0 lb

## 2021-12-20 DIAGNOSIS — M48061 Spinal stenosis, lumbar region without neurogenic claudication: Secondary | ICD-10-CM

## 2021-12-20 DIAGNOSIS — M5441 Lumbago with sciatica, right side: Secondary | ICD-10-CM

## 2021-12-20 NOTE — Progress Notes (Unsigned)
Office Visit Note   Patient: Lisa Cisneros           Date of Birth: 1951-02-09           MRN: [id number] Visit Date: 12/20/2021              Requested by: Asencion Noble, MD [address] [place],  [place] [address] PCP: Asencion Noble, MD   Assessment & Plan: Visit Diagnoses:  1. Acute right-sided low back pain with right-sided sciatica   2. Spinal stenosis of lumbar region without neurogenic claudication     Plan:   Follow-Up Instructions: Return if symptoms worsen or fail to improve.   Orders:  Orders Placed This Encounter  Procedures   DG Lumbar Spine 2-3 Views   Ambulatory referral to Physical Therapy   No orders of the defined types were placed in this encounter.     Procedures: No procedures performed   Clinical Data: No additional findings.   Subjective: Chief Complaint  Patient presents with   Lower Back - Pain    Hurts pain runs down right leg and hip has had a couple episodes of couldn't straighten up.    71 year old female with lower back pain which occasionally comes across the hip and radiates down into the right leg.  She has no specific episodes of trauma but has had a couple of times where she had trouble straightening up after bending over.  Her leg pain is intermittent.  She has no bowel or bladder dysfunction.  No red flags such as weight loss or fever    Review of Systems  Negative for any red flags Objective: Vital Signs: BP 135/74   Pulse (!) 59   Ht '5\' 2"'$  (1.575 m)   Wt 102 lb (46.3 kg)   BMI 18.66 kg/m   Physical Exam Vitals and nursing note reviewed.  Constitutional:      General: She is not in acute distress.    Appearance: Normal appearance. She is not ill-appearing, toxic-appearing or diaphoretic.  HENT:     Head: Normocephalic and atraumatic.     Nose: Nose normal. No congestion or rhinorrhea.  Eyes:     General: No scleral icterus.       Right eye: No discharge.        Left eye: No discharge.     Extraocular  Movements: Extraocular movements intact.     Conjunctiva/sclera: Conjunctivae normal.     Pupils: Pupils are equal, round, and reactive to light.  Cardiovascular:     Pulses: Normal pulses.  Pulmonary:     Effort: Pulmonary effort is normal.     Breath sounds: No wheezing.  Musculoskeletal:     Cervical back: No swelling, edema, deformity, erythema, signs of trauma, lacerations, rigidity, spasms, torticollis, tenderness, bony tenderness or crepitus. No pain with movement. Normal range of motion.     Lumbar back: No swelling, edema, deformity, signs of trauma, lacerations, spasms, tenderness or bony tenderness. Normal range of motion. Negative right straight leg raise test and negative left straight leg raise test. No scoliosis.  Skin:    General: Skin is warm and dry.     Capillary Refill: Capillary refill takes less than 2 seconds.     Coloration: Skin is not jaundiced.     Findings: No erythema.  Neurological:     General: No focal deficit present.     Mental Status: She is alert and oriented to person, place, and time.  Psychiatric:  Mood and Affect: Mood normal.        Behavior: Behavior normal.        Thought Content: Thought content normal.        Judgment: Judgment normal.     Back Exam   Tests  Straight leg raise right: negative Straight leg raise left: negative      Specialty Comments:  No specialty comments available.  Imaging: No results found.   PMFS History: Patient Active Problem List   Diagnosis Date Noted   Menopausal problem 12/17/2021   Rotator cuff syndrome of right shoulder 07/24/2012   Right shoulder pain 07/24/2012   Diarrhea 06/27/2011   Change in bowel habits 06/27/2011   Abdominal cramps 06/27/2011   Past Medical History:  Diagnosis Date   Goiter    Pneumonia    Rotator cuff syndrome of right shoulder 07/24/2012    Family History  Problem Relation Age of Onset   Thyroid disease Mother    Alzheimer's disease Mother     Hyperlipidemia Mother    Hyperlipidemia Father    Heart attack Father    Colon cancer Neg Hx    Liver disease Neg Hx     Past Surgical History:  Procedure Laterality Date   CHOLECYSTECTOMY     Social History   Occupational History   Occupation: works at [employer]  Tobacco Use   Smoking status: Never   Smokeless tobacco: Never  Vaping Use   Vaping Use: Never used  Substance and Sexual Activity   Alcohol use: Not Currently    Comment: 3 glass in a week   Drug use: No   Sexual activity: Not on file   Imaging   Back pain   X-rays of the lumbar spine   Alignment slight scoliosis about 5 to 8 degrees in the lumbar region.  Mild grade 1 slight anterolisthesis L4 on 5   Otherwise overall alignment looks normal   Impression mild grade 1 anterolisthesis and mild scoliosis

## 2021-12-20 NOTE — Patient Instructions (Signed)
Physical therapy has been ordered for you at [facility] They should call you to schedule, [phone]  is the phone number to call, if you want to call to schedule.   

## 2021-12-31 DIAGNOSIS — M62552 Muscle wasting and atrophy, not elsewhere classified, left thigh: Secondary | ICD-10-CM | POA: Diagnosis not present

## 2021-12-31 DIAGNOSIS — M2569 Stiffness of other specified joint, not elsewhere classified: Secondary | ICD-10-CM | POA: Diagnosis not present

## 2021-12-31 DIAGNOSIS — M545 Low back pain, unspecified: Secondary | ICD-10-CM | POA: Diagnosis not present

## 2021-12-31 DIAGNOSIS — M62551 Muscle wasting and atrophy, not elsewhere classified, right thigh: Secondary | ICD-10-CM | POA: Diagnosis not present

## 2022-01-05 DIAGNOSIS — M545 Low back pain, unspecified: Secondary | ICD-10-CM | POA: Diagnosis not present

## 2022-01-05 DIAGNOSIS — M62552 Muscle wasting and atrophy, not elsewhere classified, left thigh: Secondary | ICD-10-CM | POA: Diagnosis not present

## 2022-01-05 DIAGNOSIS — M62551 Muscle wasting and atrophy, not elsewhere classified, right thigh: Secondary | ICD-10-CM | POA: Diagnosis not present

## 2022-01-05 DIAGNOSIS — M2569 Stiffness of other specified joint, not elsewhere classified: Secondary | ICD-10-CM | POA: Diagnosis not present

## 2022-01-11 DIAGNOSIS — M545 Low back pain, unspecified: Secondary | ICD-10-CM | POA: Diagnosis not present

## 2022-01-11 DIAGNOSIS — M62551 Muscle wasting and atrophy, not elsewhere classified, right thigh: Secondary | ICD-10-CM | POA: Diagnosis not present

## 2022-01-11 DIAGNOSIS — M2569 Stiffness of other specified joint, not elsewhere classified: Secondary | ICD-10-CM | POA: Diagnosis not present

## 2022-01-11 DIAGNOSIS — M62552 Muscle wasting and atrophy, not elsewhere classified, left thigh: Secondary | ICD-10-CM | POA: Diagnosis not present

## 2022-01-14 DIAGNOSIS — M2569 Stiffness of other specified joint, not elsewhere classified: Secondary | ICD-10-CM | POA: Diagnosis not present

## 2022-01-14 DIAGNOSIS — M545 Low back pain, unspecified: Secondary | ICD-10-CM | POA: Diagnosis not present

## 2022-01-14 DIAGNOSIS — M62552 Muscle wasting and atrophy, not elsewhere classified, left thigh: Secondary | ICD-10-CM | POA: Diagnosis not present

## 2022-01-14 DIAGNOSIS — M62551 Muscle wasting and atrophy, not elsewhere classified, right thigh: Secondary | ICD-10-CM | POA: Diagnosis not present

## 2022-01-18 DIAGNOSIS — M2569 Stiffness of other specified joint, not elsewhere classified: Secondary | ICD-10-CM | POA: Diagnosis not present

## 2022-01-18 DIAGNOSIS — M545 Low back pain, unspecified: Secondary | ICD-10-CM | POA: Diagnosis not present

## 2022-01-18 DIAGNOSIS — M62551 Muscle wasting and atrophy, not elsewhere classified, right thigh: Secondary | ICD-10-CM | POA: Diagnosis not present

## 2022-01-18 DIAGNOSIS — M62552 Muscle wasting and atrophy, not elsewhere classified, left thigh: Secondary | ICD-10-CM | POA: Diagnosis not present

## 2022-01-20 DIAGNOSIS — M2569 Stiffness of other specified joint, not elsewhere classified: Secondary | ICD-10-CM | POA: Diagnosis not present

## 2022-01-20 DIAGNOSIS — M545 Low back pain, unspecified: Secondary | ICD-10-CM | POA: Diagnosis not present

## 2022-01-20 DIAGNOSIS — M62551 Muscle wasting and atrophy, not elsewhere classified, right thigh: Secondary | ICD-10-CM | POA: Diagnosis not present

## 2022-01-20 DIAGNOSIS — M62552 Muscle wasting and atrophy, not elsewhere classified, left thigh: Secondary | ICD-10-CM | POA: Diagnosis not present

## 2022-01-26 DIAGNOSIS — M545 Low back pain, unspecified: Secondary | ICD-10-CM | POA: Diagnosis not present

## 2022-01-26 DIAGNOSIS — M2569 Stiffness of other specified joint, not elsewhere classified: Secondary | ICD-10-CM | POA: Diagnosis not present

## 2022-01-26 DIAGNOSIS — M62551 Muscle wasting and atrophy, not elsewhere classified, right thigh: Secondary | ICD-10-CM | POA: Diagnosis not present

## 2022-01-26 DIAGNOSIS — M62552 Muscle wasting and atrophy, not elsewhere classified, left thigh: Secondary | ICD-10-CM | POA: Diagnosis not present

## 2022-01-27 DIAGNOSIS — M545 Low back pain, unspecified: Secondary | ICD-10-CM | POA: Diagnosis not present

## 2022-01-27 DIAGNOSIS — M2569 Stiffness of other specified joint, not elsewhere classified: Secondary | ICD-10-CM | POA: Diagnosis not present

## 2022-01-27 DIAGNOSIS — M62551 Muscle wasting and atrophy, not elsewhere classified, right thigh: Secondary | ICD-10-CM | POA: Diagnosis not present

## 2022-01-27 DIAGNOSIS — M62552 Muscle wasting and atrophy, not elsewhere classified, left thigh: Secondary | ICD-10-CM | POA: Diagnosis not present

## 2022-01-31 DIAGNOSIS — M2569 Stiffness of other specified joint, not elsewhere classified: Secondary | ICD-10-CM | POA: Diagnosis not present

## 2022-01-31 DIAGNOSIS — M545 Low back pain, unspecified: Secondary | ICD-10-CM | POA: Diagnosis not present

## 2022-01-31 DIAGNOSIS — M62551 Muscle wasting and atrophy, not elsewhere classified, right thigh: Secondary | ICD-10-CM | POA: Diagnosis not present

## 2022-01-31 DIAGNOSIS — M62552 Muscle wasting and atrophy, not elsewhere classified, left thigh: Secondary | ICD-10-CM | POA: Diagnosis not present

## 2022-02-04 DIAGNOSIS — M2569 Stiffness of other specified joint, not elsewhere classified: Secondary | ICD-10-CM | POA: Diagnosis not present

## 2022-02-04 DIAGNOSIS — M62551 Muscle wasting and atrophy, not elsewhere classified, right thigh: Secondary | ICD-10-CM | POA: Diagnosis not present

## 2022-02-04 DIAGNOSIS — M62552 Muscle wasting and atrophy, not elsewhere classified, left thigh: Secondary | ICD-10-CM | POA: Diagnosis not present

## 2022-02-04 DIAGNOSIS — M545 Low back pain, unspecified: Secondary | ICD-10-CM | POA: Diagnosis not present

## 2022-02-10 DIAGNOSIS — M545 Low back pain, unspecified: Secondary | ICD-10-CM | POA: Diagnosis not present

## 2022-02-10 DIAGNOSIS — M62552 Muscle wasting and atrophy, not elsewhere classified, left thigh: Secondary | ICD-10-CM | POA: Diagnosis not present

## 2022-02-10 DIAGNOSIS — M2569 Stiffness of other specified joint, not elsewhere classified: Secondary | ICD-10-CM | POA: Diagnosis not present

## 2022-02-10 DIAGNOSIS — M62551 Muscle wasting and atrophy, not elsewhere classified, right thigh: Secondary | ICD-10-CM | POA: Diagnosis not present

## 2022-02-18 DIAGNOSIS — M2569 Stiffness of other specified joint, not elsewhere classified: Secondary | ICD-10-CM | POA: Diagnosis not present

## 2022-02-18 DIAGNOSIS — M62552 Muscle wasting and atrophy, not elsewhere classified, left thigh: Secondary | ICD-10-CM | POA: Diagnosis not present

## 2022-02-18 DIAGNOSIS — M62551 Muscle wasting and atrophy, not elsewhere classified, right thigh: Secondary | ICD-10-CM | POA: Diagnosis not present

## 2022-02-18 DIAGNOSIS — M545 Low back pain, unspecified: Secondary | ICD-10-CM | POA: Diagnosis not present

## 2022-02-21 DIAGNOSIS — M62552 Muscle wasting and atrophy, not elsewhere classified, left thigh: Secondary | ICD-10-CM | POA: Diagnosis not present

## 2022-02-21 DIAGNOSIS — M545 Low back pain, unspecified: Secondary | ICD-10-CM | POA: Diagnosis not present

## 2022-02-21 DIAGNOSIS — M2569 Stiffness of other specified joint, not elsewhere classified: Secondary | ICD-10-CM | POA: Diagnosis not present

## 2022-02-21 DIAGNOSIS — M62551 Muscle wasting and atrophy, not elsewhere classified, right thigh: Secondary | ICD-10-CM | POA: Diagnosis not present

## 2022-02-25 DIAGNOSIS — M62551 Muscle wasting and atrophy, not elsewhere classified, right thigh: Secondary | ICD-10-CM | POA: Diagnosis not present

## 2022-02-25 DIAGNOSIS — M62552 Muscle wasting and atrophy, not elsewhere classified, left thigh: Secondary | ICD-10-CM | POA: Diagnosis not present

## 2022-02-25 DIAGNOSIS — M2569 Stiffness of other specified joint, not elsewhere classified: Secondary | ICD-10-CM | POA: Diagnosis not present

## 2022-02-25 DIAGNOSIS — M545 Low back pain, unspecified: Secondary | ICD-10-CM | POA: Diagnosis not present

## 2022-02-28 DIAGNOSIS — M62551 Muscle wasting and atrophy, not elsewhere classified, right thigh: Secondary | ICD-10-CM | POA: Diagnosis not present

## 2022-02-28 DIAGNOSIS — M545 Low back pain, unspecified: Secondary | ICD-10-CM | POA: Diagnosis not present

## 2022-02-28 DIAGNOSIS — M2569 Stiffness of other specified joint, not elsewhere classified: Secondary | ICD-10-CM | POA: Diagnosis not present

## 2022-02-28 DIAGNOSIS — M62552 Muscle wasting and atrophy, not elsewhere classified, left thigh: Secondary | ICD-10-CM | POA: Diagnosis not present

## 2022-06-16 DIAGNOSIS — E785 Hyperlipidemia, unspecified: Secondary | ICD-10-CM | POA: Diagnosis not present

## 2022-06-16 DIAGNOSIS — G629 Polyneuropathy, unspecified: Secondary | ICD-10-CM | POA: Diagnosis not present

## 2022-06-16 DIAGNOSIS — F909 Attention-deficit hyperactivity disorder, unspecified type: Secondary | ICD-10-CM | POA: Diagnosis not present

## 2022-06-16 DIAGNOSIS — R7301 Impaired fasting glucose: Secondary | ICD-10-CM | POA: Diagnosis not present

## 2022-06-16 DIAGNOSIS — Z79899 Other long term (current) drug therapy: Secondary | ICD-10-CM | POA: Diagnosis not present

## 2022-06-16 DIAGNOSIS — E0500 Thyrotoxicosis with diffuse goiter without thyrotoxic crisis or storm: Secondary | ICD-10-CM | POA: Diagnosis not present

## 2022-06-23 DIAGNOSIS — E042 Nontoxic multinodular goiter: Secondary | ICD-10-CM | POA: Diagnosis not present

## 2022-06-23 DIAGNOSIS — E785 Hyperlipidemia, unspecified: Secondary | ICD-10-CM | POA: Diagnosis not present

## 2022-06-23 DIAGNOSIS — J180 Bronchopneumonia, unspecified organism: Secondary | ICD-10-CM | POA: Diagnosis not present

## 2022-06-23 DIAGNOSIS — Z0001 Encounter for general adult medical examination with abnormal findings: Secondary | ICD-10-CM | POA: Diagnosis not present

## 2022-06-23 DIAGNOSIS — R7309 Other abnormal glucose: Secondary | ICD-10-CM | POA: Diagnosis not present

## 2022-06-23 DIAGNOSIS — R413 Other amnesia: Secondary | ICD-10-CM | POA: Diagnosis not present

## 2022-06-29 ENCOUNTER — Other Ambulatory Visit: Payer: Self-pay | Admitting: Internal Medicine

## 2022-06-29 DIAGNOSIS — G3184 Mild cognitive impairment, so stated: Secondary | ICD-10-CM

## 2022-07-27 ENCOUNTER — Ambulatory Visit (HOSPITAL_COMMUNITY)
Admission: RE | Admit: 2022-07-27 | Discharge: 2022-07-27 | Disposition: A | Discharge status: Home / Self Care | Payer: Medicare HMO | Source: Ambulatory Visit | Attending: Internal Medicine | Admitting: Internal Medicine

## 2022-07-27 DIAGNOSIS — G3184 Mild cognitive impairment, so stated: Secondary | ICD-10-CM | POA: Insufficient documentation

## 2022-07-27 DIAGNOSIS — R413 Other amnesia: Secondary | ICD-10-CM | POA: Diagnosis not present

## 2022-07-27 MED ORDER — GADOBUTROL 1 MMOL/ML IV SOLN
6.0000 mL | Freq: Once | INTRAVENOUS | Status: AC | PRN
  Administered 2022-07-27: 6 mL via INTRAVENOUS

## 2022-08-03 DIAGNOSIS — R413 Other amnesia: Secondary | ICD-10-CM | POA: Diagnosis not present

## 2022-08-29 DIAGNOSIS — L820 Inflamed seborrheic keratosis: Secondary | ICD-10-CM | POA: Diagnosis not present

## 2022-08-29 DIAGNOSIS — I788 Other diseases of capillaries: Secondary | ICD-10-CM | POA: Diagnosis not present

## 2022-08-29 DIAGNOSIS — D225 Melanocytic nevi of trunk: Secondary | ICD-10-CM | POA: Diagnosis not present

## 2022-08-29 DIAGNOSIS — Z1283 Encounter for screening for malignant neoplasm of skin: Secondary | ICD-10-CM | POA: Diagnosis not present

## 2022-09-14 DIAGNOSIS — Z1231 Encounter for screening mammogram for malignant neoplasm of breast: Secondary | ICD-10-CM | POA: Diagnosis not present

## 2023-06-19 DIAGNOSIS — Z79899 Other long term (current) drug therapy: Secondary | ICD-10-CM | POA: Diagnosis not present

## 2023-06-19 DIAGNOSIS — E042 Nontoxic multinodular goiter: Secondary | ICD-10-CM | POA: Diagnosis not present

## 2023-06-19 DIAGNOSIS — G609 Hereditary and idiopathic neuropathy, unspecified: Secondary | ICD-10-CM | POA: Diagnosis not present

## 2023-06-19 DIAGNOSIS — R7301 Impaired fasting glucose: Secondary | ICD-10-CM | POA: Diagnosis not present

## 2023-06-19 DIAGNOSIS — E785 Hyperlipidemia, unspecified: Secondary | ICD-10-CM | POA: Diagnosis not present

## 2023-06-19 DIAGNOSIS — R413 Other amnesia: Secondary | ICD-10-CM | POA: Diagnosis not present

## 2023-06-26 DIAGNOSIS — R413 Other amnesia: Secondary | ICD-10-CM | POA: Diagnosis not present

## 2023-06-26 DIAGNOSIS — E042 Nontoxic multinodular goiter: Secondary | ICD-10-CM | POA: Diagnosis not present

## 2023-06-26 DIAGNOSIS — E785 Hyperlipidemia, unspecified: Secondary | ICD-10-CM | POA: Diagnosis not present

## 2023-06-26 DIAGNOSIS — Z0001 Encounter for general adult medical examination with abnormal findings: Secondary | ICD-10-CM | POA: Diagnosis not present

## 2023-06-29 ENCOUNTER — Other Ambulatory Visit (HOSPITAL_COMMUNITY)
Admission: RE | Admit: 2023-06-29 | Discharge: 2023-06-29 | Disposition: A | Discharge status: Home / Self Care | Source: Ambulatory Visit | Attending: Obstetrics & Gynecology | Admitting: Obstetrics & Gynecology

## 2023-06-29 ENCOUNTER — Ambulatory Visit: Payer: Self-pay | Admitting: Obstetrics & Gynecology

## 2023-06-29 ENCOUNTER — Encounter: Payer: Self-pay | Admitting: Obstetrics & Gynecology

## 2023-06-29 VITALS — BP 121/66 | HR 57 | Ht 61.0 in | Wt 100.2 lb

## 2023-06-29 DIAGNOSIS — L292 Pruritus vulvae: Secondary | ICD-10-CM

## 2023-06-29 DIAGNOSIS — L309 Dermatitis, unspecified: Secondary | ICD-10-CM | POA: Diagnosis not present

## 2023-06-29 MED ORDER — TRIAMCINOLONE ACETONIDE 0.5 % EX OINT: 1.0000 | TOPICAL_OINTMENT | CUTANEOUS | 2 refills | Status: AC

## 2023-06-29 NOTE — Progress Notes (Signed)
   GYN VISIT Patient name: Lisa Cisneros MRN [id number]  Date of birth: [birth year] Chief Complaint:   Medication Consultation (Vaginal itching)  History of Present Illness:   Lisa Cisneros is a 73 y.o. [phone] PM female being seen today for the following concerns:  -Vulvar irritation:  Notes itching that has been going on for at least the past yr.  Mostly on the outside, not inside.  Using an OTC ointment which does seem to help for awhile, but then it will return-cannot recall the name of this medication. symptoms are typically every day- not sure what seems to make it worse or better.  Denies vaginal bleeding, discharge or odor.  Denies urinary concerns.  Denies pelvic or abdominal pain.  Not sexually active.  Denies vaginal dryness.  She reports no other acute complaints   No LMP recorded. Patient has had an ablation.    Review of Systems:   Pertinent items are noted in HPI Denies fever/chills, dizziness, headaches, visual disturbances, fatigue, shortness of breath, chest pain, abdominal pain. Pertinent History Reviewed:   Past Surgical History:  Procedure Laterality Date   CHOLECYSTECTOMY      Past Medical History:  Diagnosis Date   Pneumonia    Rotator cuff syndrome of right shoulder 07/24/2012   Reviewed problem list, medications and allergies. Physical Assessment:   Vitals:   06/29/23 0925  BP: 121/66  Pulse: (!) 57  Weight: 100 lb 3.2 oz (45.5 kg)  Height: 5\' 1"  (1.549 m)  Body mass index is 18.93 kg/m.       Physical Examination:   General appearance: alert, well appearing, and in no distress  Psych: mood appropriate, normal affect  Skin: warm & dry   Cardiovascular: normal heart rate noted  Respiratory: normal respiratory effort, no distress  Abdomen: soft, non-tender   Pelvic: VULVA: on left side side-hypopigmentation of clitoral hood with hyperkeratosis within labial fold.  No discrete lesion noted.  Atrophic changes noted as well as multiple small  angiokertaomas noted bilaterally  Extremities: no edema   Chaperone: Wendell Halt    Assessment & Plan:  1) Vulvar dermatitis - Discussed findings on exam - Rx for triamcinolone sent and we will plan to use nightly for the next 1 to 2 weeks then decrease down to 2-3 times per week - Will plan to follow-up in 3 months, should she note any problems prior to this patient to return to clinic  Meds ordered this encounter  Medications   triamcinolone ointment (KENALOG) 0.5 %    Sig: Apply 1 Application topically 2 (two) times a week.    Dispense:  15 g    Refill:  2      Return in about 3 months (around 09/29/2023) for Medication follow up, with Dr. Rielynn Trulson.   Chester Romero, DO Attending Obstetrician & Gynecologist, [facility] for [employer], [facility]

## 2023-06-30 ENCOUNTER — Ambulatory Visit: Payer: Self-pay | Admitting: Obstetrics & Gynecology

## 2023-06-30 LAB — CERVICOVAGINAL ANCILLARY ONLY
Bacterial Vaginitis (gardnerella): NEGATIVE
Candida Glabrata: NEGATIVE
Candida Vaginitis: NEGATIVE
Comment: NEGATIVE
Comment: NEGATIVE
Comment: NEGATIVE

## 2023-08-21 DIAGNOSIS — E785 Hyperlipidemia, unspecified: Secondary | ICD-10-CM | POA: Diagnosis not present

## 2023-08-21 DIAGNOSIS — Z79899 Other long term (current) drug therapy: Secondary | ICD-10-CM | POA: Diagnosis not present

## 2023-08-28 DIAGNOSIS — R413 Other amnesia: Secondary | ICD-10-CM | POA: Diagnosis not present

## 2023-08-28 DIAGNOSIS — E785 Hyperlipidemia, unspecified: Secondary | ICD-10-CM | POA: Diagnosis not present

## 2023-09-28 ENCOUNTER — Other Ambulatory Visit (HOSPITAL_COMMUNITY): Payer: Self-pay | Admitting: Internal Medicine

## 2023-09-28 DIAGNOSIS — Z1231 Encounter for screening mammogram for malignant neoplasm of breast: Secondary | ICD-10-CM

## 2023-10-13 ENCOUNTER — Ambulatory Visit (HOSPITAL_COMMUNITY)
Admission: RE | Admit: 2023-10-13 | Discharge: 2023-10-13 | Disposition: A | Discharge status: Home / Self Care | Source: Ambulatory Visit | Attending: Internal Medicine | Admitting: Internal Medicine

## 2023-10-13 DIAGNOSIS — Z1231 Encounter for screening mammogram for malignant neoplasm of breast: Secondary | ICD-10-CM | POA: Diagnosis not present

## 2023-10-24 ENCOUNTER — Inpatient Hospital Stay
Admission: RE | Admit: 2023-10-24 | Discharge: 2023-10-24 | Disposition: A | Discharge status: Home / Self Care | Payer: Self-pay | Source: Ambulatory Visit | Attending: Internal Medicine | Admitting: Internal Medicine

## 2023-10-24 ENCOUNTER — Other Ambulatory Visit (HOSPITAL_COMMUNITY): Payer: Self-pay | Admitting: Internal Medicine

## 2023-10-24 DIAGNOSIS — Z1231 Encounter for screening mammogram for malignant neoplasm of breast: Secondary | ICD-10-CM

## 2024-03-06 ENCOUNTER — Ambulatory Visit: Admitting: Orthopedic Surgery
